# Patient Record
Sex: Female | Born: 1980 | Race: Black or African American | Hispanic: No | Marital: Married | Smoking: Never smoker
Health system: Southern US, Community
[De-identification: ages and names within clinical notes are randomized; demographics above are authoritative.]

## PROBLEM LIST (undated history)

## (undated) DIAGNOSIS — R51 Headache: Secondary | ICD-10-CM

## (undated) DIAGNOSIS — R011 Cardiac murmur, unspecified: Secondary | ICD-10-CM

## (undated) DIAGNOSIS — F419 Anxiety disorder, unspecified: Secondary | ICD-10-CM

## (undated) HISTORY — DX: Cardiac murmur, unspecified: R01.1

---

## 1999-10-03 ENCOUNTER — Emergency Department (HOSPITAL_COMMUNITY): Admission: EM | Admit: 1999-10-03 | Discharge: 1999-10-03 | Payer: Self-pay | Admitting: Emergency Medicine

## 2000-01-22 ENCOUNTER — Emergency Department (HOSPITAL_COMMUNITY): Admission: EM | Admit: 2000-01-22 | Discharge: 2000-01-22 | Payer: Self-pay | Admitting: Emergency Medicine

## 2000-01-25 ENCOUNTER — Emergency Department (HOSPITAL_COMMUNITY): Admission: EM | Admit: 2000-01-25 | Discharge: 2000-01-25 | Payer: Self-pay | Admitting: Emergency Medicine

## 2000-06-07 ENCOUNTER — Inpatient Hospital Stay (HOSPITAL_COMMUNITY): Admission: AD | Admit: 2000-06-07 | Discharge: 2000-06-07 | Payer: Self-pay | Admitting: Obstetrics & Gynecology

## 2000-06-07 ENCOUNTER — Encounter: Payer: Self-pay | Admitting: Obstetrics & Gynecology

## 2000-07-03 ENCOUNTER — Ambulatory Visit (HOSPITAL_COMMUNITY): Admission: AD | Admit: 2000-07-03 | Discharge: 2000-07-03 | Payer: Self-pay | Admitting: Obstetrics and Gynecology

## 2000-07-03 ENCOUNTER — Encounter (INDEPENDENT_AMBULATORY_CARE_PROVIDER_SITE_OTHER): Payer: Self-pay

## 2001-01-26 ENCOUNTER — Inpatient Hospital Stay (HOSPITAL_COMMUNITY): Admission: AD | Admit: 2001-01-26 | Discharge: 2001-01-26 | Payer: Self-pay | Admitting: Obstetrics

## 2001-03-03 ENCOUNTER — Encounter: Payer: Self-pay | Admitting: Obstetrics

## 2001-03-03 ENCOUNTER — Inpatient Hospital Stay (HOSPITAL_COMMUNITY): Admission: AD | Admit: 2001-03-03 | Discharge: 2001-03-03 | Payer: Self-pay | Admitting: Obstetrics

## 2001-05-07 HISTORY — PX: CHOLECYSTECTOMY: SHX55

## 2002-04-13 ENCOUNTER — Emergency Department (HOSPITAL_COMMUNITY): Admission: EM | Admit: 2002-04-13 | Discharge: 2002-04-13 | Payer: Self-pay | Admitting: Emergency Medicine

## 2003-03-18 ENCOUNTER — Emergency Department (HOSPITAL_COMMUNITY): Admission: EM | Admit: 2003-03-18 | Discharge: 2003-03-18 | Payer: Self-pay | Admitting: Emergency Medicine

## 2003-05-15 ENCOUNTER — Emergency Department (HOSPITAL_COMMUNITY): Admission: EM | Admit: 2003-05-15 | Discharge: 2003-05-15 | Payer: Self-pay | Admitting: Emergency Medicine

## 2003-07-09 ENCOUNTER — Emergency Department (HOSPITAL_COMMUNITY): Admission: EM | Admit: 2003-07-09 | Discharge: 2003-07-09 | Payer: Self-pay | Admitting: *Deleted

## 2003-08-06 ENCOUNTER — Emergency Department (HOSPITAL_COMMUNITY): Admission: EM | Admit: 2003-08-06 | Discharge: 2003-08-06 | Payer: Self-pay | Admitting: Emergency Medicine

## 2003-08-07 ENCOUNTER — Emergency Department (HOSPITAL_COMMUNITY): Admission: EM | Admit: 2003-08-07 | Discharge: 2003-08-07 | Payer: Self-pay | Admitting: Emergency Medicine

## 2003-11-17 ENCOUNTER — Emergency Department (HOSPITAL_COMMUNITY): Admission: EM | Admit: 2003-11-17 | Discharge: 2003-11-18 | Payer: Self-pay | Admitting: Emergency Medicine

## 2004-01-02 ENCOUNTER — Emergency Department (HOSPITAL_COMMUNITY): Admission: EM | Admit: 2004-01-02 | Discharge: 2004-01-02 | Payer: Self-pay | Admitting: Emergency Medicine

## 2004-01-15 ENCOUNTER — Emergency Department (HOSPITAL_COMMUNITY): Admission: EM | Admit: 2004-01-15 | Discharge: 2004-01-15 | Payer: Self-pay | Admitting: Emergency Medicine

## 2004-04-08 ENCOUNTER — Emergency Department (HOSPITAL_COMMUNITY): Admission: EM | Admit: 2004-04-08 | Discharge: 2004-04-08 | Payer: Self-pay | Admitting: Emergency Medicine

## 2004-05-07 ENCOUNTER — Emergency Department (HOSPITAL_COMMUNITY): Admission: EM | Admit: 2004-05-07 | Discharge: 2004-05-07 | Payer: Self-pay | Admitting: Emergency Medicine

## 2004-06-09 ENCOUNTER — Emergency Department (HOSPITAL_COMMUNITY): Admission: EM | Admit: 2004-06-09 | Discharge: 2004-06-09 | Payer: Self-pay | Admitting: Emergency Medicine

## 2004-08-20 ENCOUNTER — Emergency Department (HOSPITAL_COMMUNITY): Admission: EM | Admit: 2004-08-20 | Discharge: 2004-08-21 | Payer: Self-pay | Admitting: Emergency Medicine

## 2004-10-18 ENCOUNTER — Emergency Department (HOSPITAL_COMMUNITY): Admission: EM | Admit: 2004-10-18 | Discharge: 2004-10-18 | Payer: Self-pay | Admitting: Family Medicine

## 2004-10-19 ENCOUNTER — Emergency Department (HOSPITAL_COMMUNITY): Admission: EM | Admit: 2004-10-19 | Discharge: 2004-10-19 | Payer: Self-pay | Admitting: Emergency Medicine

## 2004-12-05 ENCOUNTER — Ambulatory Visit: Payer: Self-pay | Admitting: Internal Medicine

## 2005-04-16 ENCOUNTER — Emergency Department (HOSPITAL_COMMUNITY): Admission: EM | Admit: 2005-04-16 | Discharge: 2005-04-16 | Payer: Self-pay | Admitting: Emergency Medicine

## 2005-05-22 ENCOUNTER — Emergency Department (HOSPITAL_COMMUNITY): Admission: EM | Admit: 2005-05-22 | Discharge: 2005-05-22 | Payer: Self-pay | Admitting: Emergency Medicine

## 2005-05-25 ENCOUNTER — Emergency Department (HOSPITAL_COMMUNITY): Admission: EM | Admit: 2005-05-25 | Discharge: 2005-05-25 | Payer: Self-pay | Admitting: Emergency Medicine

## 2005-06-03 ENCOUNTER — Emergency Department (HOSPITAL_COMMUNITY): Admission: EM | Admit: 2005-06-03 | Discharge: 2005-06-03 | Payer: Self-pay | Admitting: Family Medicine

## 2005-07-07 ENCOUNTER — Emergency Department (HOSPITAL_COMMUNITY): Admission: EM | Admit: 2005-07-07 | Discharge: 2005-07-08 | Payer: Self-pay | Admitting: Emergency Medicine

## 2005-10-31 ENCOUNTER — Emergency Department (HOSPITAL_COMMUNITY): Admission: EM | Admit: 2005-10-31 | Discharge: 2005-10-31 | Payer: Self-pay | Admitting: Emergency Medicine

## 2006-03-31 ENCOUNTER — Emergency Department (HOSPITAL_COMMUNITY): Admission: EM | Admit: 2006-03-31 | Discharge: 2006-03-31 | Payer: Self-pay | Admitting: Family Medicine

## 2008-05-07 HISTORY — PX: OTHER SURGICAL HISTORY: SHX169

## 2008-06-29 ENCOUNTER — Ambulatory Visit (HOSPITAL_COMMUNITY): Admission: RE | Admit: 2008-06-29 | Discharge: 2008-06-29 | Payer: Self-pay | Admitting: Obstetrics

## 2008-07-23 ENCOUNTER — Ambulatory Visit (HOSPITAL_COMMUNITY): Admission: RE | Admit: 2008-07-23 | Discharge: 2008-07-23 | Payer: Self-pay | Admitting: Obstetrics

## 2010-08-17 LAB — CBC
HCT: 37.7 % (ref 36.0–46.0)
Hemoglobin: 12.5 g/dL (ref 12.0–15.0)
MCHC: 33.2 g/dL (ref 30.0–36.0)
MCV: 92.3 fL (ref 78.0–100.0)
Platelets: 271 10*3/uL (ref 150–400)
RBC: 4.08 MIL/uL (ref 3.87–5.11)
RDW: 13.4 % (ref 11.5–15.5)
WBC: 2.9 10*3/uL — ABNORMAL LOW (ref 4.0–10.5)

## 2010-08-17 LAB — PREGNANCY, URINE: Preg Test, Ur: NEGATIVE

## 2010-09-19 NOTE — Op Note (Signed)
April Newton, April Newton                  ACCOUNT NO.:  [id number]   MEDICAL RECORD NO.:  [id number]          PATIENT TYPE:  AMB   LOCATION:  SDC                           FACILITY:  [facility]   PHYSICIAN:  Charles A. Clearance Coots, M.D.DATE OF BIRTH:  [birth year]   DATE OF PROCEDURE:  07/23/2008  DATE OF DISCHARGE:                               OPERATIVE REPORT   PREOPERATIVE DIAGNOSIS:  Menorrhagia.   POSTOPERATIVE DIAGNOSIS:  Menorrhagia.   PROCEDURES:  Hysteroscopy, NovaSure bipolar endometrial ablation, and  insertion of Mirena intrauterine device.   SURGEON:  Charles A. Clearance Coots, MD   ANESTHESIA:  General.   ESTIMATED BLOOD LOSS:  Minimal.   COMPLICATIONS:  None.   SPECIMEN:  None.   FINDINGS:  Normal endometrial cavity.   OPERATION:  The patient was brought to the operating room and after  satisfactory general endotracheal anesthesia, the vagina was prepped and  draped in the usual sterile fashion.  The urinary bladder was emptied of  approximately 100 mL of clear urine.  Bimanual examination revealed the  uterus to be mid position.  A sterile Graves speculum was inserted in  the vaginal vault and the cervix was isolated.  The anterior lip of the  cervix was grasped with a single-tooth tenaculum.  Paracervical block of  approximately 20 mL of 1% Xylocaine was injected in the each lateral  fornix with total of 10 mL in each lateral fornix.  The cervical canal  was then measured 4 cm from the external os to the internal os with a  Hegar dilator.  The uterus was then sounded to 11 cm equaling a total  cavity length of 6.5 cm, cavity width was 3.5 cm.  The 5-mm hysteroscope  was then passed into the uterine cavity and a survey of the endometrial  cavity was performed.  There were no polyps or submucous fibroids  observed.  The hysteroscope was then removed.  The NovaSure bipolar  ablation instrument was then passed into the uterine cavity, and the  bipolar endometrial ablation was then  performed in routine fashion at a  cavity width of 3.5 cm at a power of 125 watts for 1 minute and 23  seconds.  All instruments were then retired.  The Mirena IUD was then  inserted into  the uterine cavity in routine fashion without complications, and the  string was cut approximately 1 to 1-1/2 inches.  All instruments were  then again retired, and there was no active bleeding at the conclusion  of the procedure.  The patient tolerated the procedure well and was  transported to the recovery room in satisfactory condition.       Charles A. Clearance Coots, M.D.  Electronically Signed     CAH/MEDQ  D:  07/23/2008  T:  07/24/2008  Job:  [id number]

## 2010-09-22 NOTE — Op Note (Signed)
[facility]  Patient:    April Newton, April Newton                          MRN: [id number] Proc. Date: 07/03/00 Adm. Date:  [id number] Attending:  Leonard Schwartz                           Operative Report  PREOPERATIVE DIAGNOSIS:         First trimester missed abortion.  POSTOPERATIVE DIAGNOSIS:        First trimester missed abortion.  OPERATION:                      Suction dilatation and evacuation.  SURGEON:                        Janine Limbo, M.D.  ANESTHESIA:                     IV sedation and paracervical block.  INDICATIONS:                    The patient is a 30 year old who presents with a nine week gestation.  Ultrasound confirms that there is no fetal heart motion present any longer and that the gestation is nonviable.  The patient presents today with bleeding and cramping.  She understands the indications for her surgical procedure and she accepts the risks of, but not limited to, anesthetic complications, bleeding, infections and possible damage to the surrounding organs.  FINDINGS:                       The patients blood type is A positive.  The uterus was approximately 10 weeks size.  No adnexal masses were appreciated. A moderate to large amount of products of conception were removed from within the uterine cavity.  DESCRIPTION OF PROCEDURE:       The patient was taken to the operating room where she was given medications through her IV line.  The patients perineum and vagina were prepped with multiple layers of Betadine.  The bladder was drained of urine.  The patient was sterilely draped.  Examination under anesthesia was performed.  A paracervical block was placed using 10 cc of 0.5% Marcaine.  The cervix sounded to 10 cm.  The cervix was already slightly dilated.  It was dilated even further.  The uterine cavity was then evacuated using a #8 suction curet and a medium sharp curet.  The cavity was felt to be clean at the  end of the procedure.  Hemostasis was noted to be adequate.  The patient was awakened from her anesthetic and taken to the recovery room in stable condition.  The uterus was noted to be firm at the end of our procedure.  Sponge, needle and instrument counts were correct.  The estimated blood loss was 20 cc.  FOLLOW-UP INSTRUCTIONS:  The patient will return to the office in two to three weeks for follow-up examination.  She was given a copy of the postoperative instruction sheet as prepared by the [facility] for patients who have undergone a D&C.  She was given a prescription for Darvocet-N 100 mg (one q.6h. p.r.n. pain). DD:  07/03/00 TD:  07/04/00 Job: [id number]

## 2011-04-20 ENCOUNTER — Other Ambulatory Visit: Payer: Self-pay | Admitting: Obstetrics

## 2011-05-08 HISTORY — PX: BREAST EXCISIONAL BIOPSY: SUR124

## 2011-07-09 ENCOUNTER — Other Ambulatory Visit: Payer: Self-pay | Admitting: Obstetrics

## 2011-09-21 ENCOUNTER — Other Ambulatory Visit: Payer: Self-pay | Admitting: Obstetrics

## 2011-09-21 DIAGNOSIS — N6452 Nipple discharge: Secondary | ICD-10-CM

## 2011-09-27 ENCOUNTER — Ambulatory Visit
Admission: RE | Admit: 2011-09-27 | Discharge: 2011-09-27 | Disposition: A | Discharge status: Home / Self Care | Payer: Managed Care, Other (non HMO) | Source: Ambulatory Visit | Attending: Obstetrics | Admitting: Obstetrics

## 2011-09-27 ENCOUNTER — Other Ambulatory Visit: Payer: Self-pay | Admitting: Obstetrics

## 2011-09-27 DIAGNOSIS — N6452 Nipple discharge: Secondary | ICD-10-CM

## 2011-10-03 ENCOUNTER — Ambulatory Visit (INDEPENDENT_AMBULATORY_CARE_PROVIDER_SITE_OTHER): Payer: Managed Care, Other (non HMO) | Admitting: Surgery

## 2011-10-03 ENCOUNTER — Encounter (INDEPENDENT_AMBULATORY_CARE_PROVIDER_SITE_OTHER): Payer: Self-pay | Admitting: Surgery

## 2011-10-03 VITALS — BP 134/86 | HR 94 | Temp 98.1°F | Ht 66.0 in | Wt 148.4 lb

## 2011-10-03 DIAGNOSIS — N6459 Other signs and symptoms in breast: Secondary | ICD-10-CM

## 2011-10-03 DIAGNOSIS — N6452 Nipple discharge: Secondary | ICD-10-CM | POA: Insufficient documentation

## 2011-10-03 NOTE — Patient Instructions (Signed)
We will arrange surgery to remove some ductal tissue from the right breast

## 2011-10-03 NOTE — Progress Notes (Signed)
NAME: April Newton DOB: 07/24/1980 MRN: [id number]                                                                                      DATE: 10/03/2011  PCP: No primary provider on file. Referring Provider: No ref. 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 Breast Discharge    eval Rt nipple d/c    HPI:  April Newton is a 31 y.o.  female who presents for evaluation of nipple discharge, right, of two or three weeks duration with no other symptoms. Mother's cousin is only FH of breast cancer. She had mammogram and ductogram and a dilated duct with filling defect was noted  PMH:  has a past medical history of Heart murmur.  PSH:   has past surgical history that includes Cesarean section (06/15/2001); Cholecystectomy (2003); and uterine ablasion (2010).  ALLERGIES:  No Known Allergies  MEDICATIONS: Current outpatient prescriptions:Butalbital-APAP-Caffeine (FIORICET PO), Take 40 mg by mouth as needed., Disp: , Rfl: ;  Butalbital-Aspirin-Caffeine (FARBITAL PO), Take by mouth., Disp: , Rfl: ;  ibuprofen (ADVIL,MOTRIN) 800 MG tablet, Take 800 mg by mouth as needed., Disp: , Rfl: ;  topiramate (TOPAMAX) 50 MG tablet, Take 50 mg by mouth daily., Disp: , Rfl:   ROS: Negative  EXAM:   GENERAL:  The patient is alert, oriented, and generally healthy-appearing, NAD. Mood and affect are normal.  HEENT:  The head is normocephalic, the eyes nonicteric, the pupils were round regular and equal. EOMs are normal. Pharynx normal. Dentition good.  NECK:  The neck is supple and there are no masses or thyromegaly.  LUNGS: Normal respirations and clear to auscultation.  HEART: Regular rhythm, with no murmurs rubs or gallops. Pulses are intact carotid dorsalis pedis and posterior tibial. No significant  varicosities are noted.  BREASTS:  Right breast has an easily expressible discharge which appear tinged with blood. No mass or other abnormality and the left breast is normal  LYMPHATICS: No axillary or supraclavicular adenopathy noted  ABDOMEN: Soft, flat, and nontender. No masses or organomegaly is noted. No hernias are noted. Bowel sounds are normal.  EXTREMITIES:  Good range of motion, no edema.   DATA REVIEWED:  Mammogram and ductogram reports    Johnthan Axtman J 10/03/2011         

## 2011-10-12 ENCOUNTER — Encounter (HOSPITAL_COMMUNITY): Payer: Self-pay | Admitting: Pharmacy Technician

## 2011-10-15 NOTE — Progress Notes (Addendum)
Call to pt. On home phone, voicemail is full. Call all three phone nos., left voicemail x2 lines.

## 2011-10-16 ENCOUNTER — Inpatient Hospital Stay (HOSPITAL_COMMUNITY)
Admission: RE | Admit: 2011-10-16 | Discharge: 2011-10-16 | Payer: Managed Care, Other (non HMO) | Source: Ambulatory Visit

## 2011-10-16 ENCOUNTER — Encounter (HOSPITAL_COMMUNITY): Payer: Self-pay

## 2011-10-16 HISTORY — DX: Headache: R51

## 2011-10-16 HISTORY — DX: Anxiety disorder, unspecified: F41.9

## 2011-10-16 NOTE — Pre-Procedure Instructions (Signed)
[name]  10/16/2011   Your procedure is scheduled on  10-23-2011  Report to [facility] Short Stay [facility] at 5:30 AM.  Call this number if you have problems the morning of surgery: [phone]   Remember:   Do not eat food:After Midnight.  May have clear liquids: up to 4 Hours before arrival.  Clear liquids include soda, tea, black coffee, apple or grape juice, broth.until 1:30 AM  Take these medicines the morning of surgery with A SIP OF WATER  Fioricet if needed for headache   Do not wear jewelry, make-up or nail polish.  Do not wear lotions, powders, or perfumes. You may wear deodorant.  Do not shave 48 hours prior to surgery. Men may shave face and neck.  Do not bring valuables to the hospital.  Contacts, dentures or bridgework may not be worn into surgery.  Leave suitcase in the car. After surgery it may be brought to your room.  For patients admitted to the hospital, checkout time is 11:00 AM the day of discharge.   Patients discharged the day of surgery will not be allowed to drive home.  Name and phone number of your driver  Special Instructions: CHG Shower Use Special Wash: 1/2 bottle night before surgery and 1/2 bottle morning of surgery.   Please read over the following fact sheets that you were given: Pain Booklet, Coughing and Deep Breathing, MRSA Information and Surgical Site Infection Prevention

## 2011-10-17 ENCOUNTER — Ambulatory Visit (HOSPITAL_BASED_OUTPATIENT_CLINIC_OR_DEPARTMENT_OTHER): Admit: 2011-10-17 | Payer: Self-pay | Admitting: Surgery

## 2011-10-17 ENCOUNTER — Encounter (HOSPITAL_BASED_OUTPATIENT_CLINIC_OR_DEPARTMENT_OTHER): Payer: Self-pay

## 2011-10-17 SURGERY — EXCISION DUCTAL SYSTEM BREAST
Anesthesia: General | Laterality: Right

## 2011-10-17 NOTE — Pre-Procedure Instructions (Signed)
[name]  10/17/2011   Your procedure is scheduled on:  Tuesday, June 18th.  Report to [facility] at 5:30AM.  Call this number if you have problems the morning of surgery: [phone]   Remember:   Do not eat food or dirnk any liquid and midnight on Monday, June 17th.   Take these medicines the morning of surgery with A SIP OF WATER: Topamax.  Stop Ibuprofen (Advil, Motrin) and NSAIDS, Aspirin or herbal medications.   Do not wear jewelry, make-up or nail polish.  Do not wear lotions, powders, or perfumes. You may wear deodorant.  Do not shave 48 hours prior to surgery. Men may shave face and neck.  Do not bring valuables to the hospital.  Contacts, dentures or bridgework may not be worn into surgery.  Leave suitcase in the car. After surgery it may be brought to your room.  For patients admitted to the hospital, checkout time is 11:00 AM the day of discharge.   Patients discharged the day of surgery will not be allowed to drive home.  Name and phone number of your driver: ___  Special Instructions: CHG Shower Use Special Wash: 1/2 bottle night before surgery and 1/2 bottle morning of surgery.   Please read over the following fact sheets that you were given: Pain Booklet, Coughing and Deep Breathing, MRSA Information and Surgical Site Infection Prevention

## 2011-10-18 ENCOUNTER — Encounter (HOSPITAL_COMMUNITY)
Admission: RE | Admit: 2011-10-18 | Discharge: 2011-10-18 | Disposition: A | Discharge status: Home / Self Care | Payer: Managed Care, Other (non HMO) | Source: Ambulatory Visit | Attending: Surgery | Admitting: Surgery

## 2011-10-18 LAB — CBC
HCT: 39.1 % (ref 36.0–46.0)
Hemoglobin: 13.2 g/dL (ref 12.0–15.0)
MCH: 30.5 pg (ref 26.0–34.0)
MCHC: 33.8 g/dL (ref 30.0–36.0)
MCV: 90.3 fL (ref 78.0–100.0)
Platelets: 248 10*3/uL (ref 150–400)
RBC: 4.33 MIL/uL (ref 3.87–5.11)
RDW: 12.8 % (ref 11.5–15.5)
WBC: 3 10*3/uL — ABNORMAL LOW (ref 4.0–10.5)

## 2011-10-18 LAB — HCG, SERUM, QUALITATIVE: Preg, Serum: NEGATIVE

## 2011-10-18 LAB — SURGICAL PCR SCREEN
MRSA, PCR: NEGATIVE
Staphylococcus aureus: POSITIVE — AB

## 2011-10-22 MED ORDER — CEFAZOLIN SODIUM 1-5 GM-% IV SOLN
1.0000 g | INTRAVENOUS | Status: AC
  Administered 2011-10-23: 1 g via INTRAVENOUS
  Filled 2011-10-22: qty 50

## 2011-10-23 ENCOUNTER — Encounter (HOSPITAL_COMMUNITY): Payer: Self-pay | Admitting: Anesthesiology

## 2011-10-23 ENCOUNTER — Ambulatory Visit (HOSPITAL_COMMUNITY): Payer: Managed Care, Other (non HMO) | Admitting: Anesthesiology

## 2011-10-23 ENCOUNTER — Ambulatory Visit (HOSPITAL_COMMUNITY)
Admission: RE | Admit: 2011-10-23 | Discharge: 2011-10-23 | Disposition: A | Discharge status: Home / Self Care | Payer: Managed Care, Other (non HMO) | Source: Ambulatory Visit | Attending: Surgery | Admitting: Surgery

## 2011-10-23 ENCOUNTER — Encounter (HOSPITAL_COMMUNITY)
Admission: RE | Disposition: A | Discharge status: Home / Self Care | Payer: Self-pay | Source: Ambulatory Visit | Attending: Surgery

## 2011-10-23 ENCOUNTER — Encounter (HOSPITAL_COMMUNITY): Payer: Self-pay | Admitting: *Deleted

## 2011-10-23 DIAGNOSIS — D249 Benign neoplasm of unspecified breast: Secondary | ICD-10-CM | POA: Insufficient documentation

## 2011-10-23 DIAGNOSIS — N6019 Diffuse cystic mastopathy of unspecified breast: Secondary | ICD-10-CM

## 2011-10-23 DIAGNOSIS — N6452 Nipple discharge: Secondary | ICD-10-CM

## 2011-10-23 DIAGNOSIS — R51 Headache: Secondary | ICD-10-CM | POA: Insufficient documentation

## 2011-10-23 DIAGNOSIS — Z01812 Encounter for preprocedural laboratory examination: Secondary | ICD-10-CM | POA: Insufficient documentation

## 2011-10-23 HISTORY — PX: BREAST DUCTAL SYSTEM EXCISION: SHX5242

## 2011-10-23 SURGERY — EXCISION DUCTAL SYSTEM BREAST
Anesthesia: General | Site: Breast | Laterality: Right | Wound class: Clean

## 2011-10-23 MED ORDER — HYDROMORPHONE HCL PF 1 MG/ML IJ SOLN: 0.2500 mg | INTRAMUSCULAR | Status: DC | PRN

## 2011-10-23 MED ORDER — ONDANSETRON HCL 4 MG/2ML IJ SOLN: 4.0000 mg | Freq: Once | INTRAMUSCULAR | Status: DC | PRN

## 2011-10-23 MED ORDER — CHLORHEXIDINE GLUCONATE 4 % EX LIQD: 1.0000 "application " | Freq: Once | CUTANEOUS | Status: DC

## 2011-10-23 MED ORDER — BUPIVACAINE HCL (PF) 0.25 % IJ SOLN
INTRAMUSCULAR | Status: DC | PRN
  Administered 2011-10-23: 30 mL

## 2011-10-23 MED ORDER — 0.9 % SODIUM CHLORIDE (POUR BTL) OPTIME
TOPICAL | Status: DC | PRN
  Administered 2011-10-23: 1000 mL

## 2011-10-23 MED ORDER — PROPOFOL 10 MG/ML IV BOLUS
INTRAVENOUS | Status: DC | PRN
  Administered 2011-10-23: 200 mg via INTRAVENOUS

## 2011-10-23 MED ORDER — MIDAZOLAM HCL 5 MG/5ML IJ SOLN
INTRAMUSCULAR | Status: DC | PRN
  Administered 2011-10-23: 2 mg via INTRAVENOUS

## 2011-10-23 MED ORDER — METHYLENE BLUE 1 % INJ SOLN
INTRAMUSCULAR | Status: AC
  Filled 2011-10-23: qty 10

## 2011-10-23 MED ORDER — FENTANYL CITRATE 0.05 MG/ML IJ SOLN
INTRAMUSCULAR | Status: DC | PRN
  Administered 2011-10-23: 150 ug via INTRAVENOUS

## 2011-10-23 MED ORDER — ONDANSETRON HCL 4 MG/2ML IJ SOLN
INTRAMUSCULAR | Status: DC | PRN
  Administered 2011-10-23: 4 mg via INTRAVENOUS

## 2011-10-23 MED ORDER — HYDROCODONE-ACETAMINOPHEN 5-325 MG PO TABS: 1.0000 | ORAL_TABLET | ORAL | Status: DC | PRN

## 2011-10-23 MED ORDER — BUPIVACAINE HCL (PF) 0.25 % IJ SOLN
INTRAMUSCULAR | Status: AC
  Filled 2011-10-23: qty 30

## 2011-10-23 MED ORDER — SODIUM CHLORIDE 0.9 % IJ SOLN
INTRAMUSCULAR | Status: DC | PRN
  Administered 2011-10-23: 08:00:00

## 2011-10-23 MED ORDER — LACTATED RINGERS IV SOLN
INTRAVENOUS | Status: DC | PRN
  Administered 2011-10-23: 07:00:00 via INTRAVENOUS

## 2011-10-23 MED ORDER — LIDOCAINE HCL (CARDIAC) 20 MG/ML IV SOLN
INTRAVENOUS | Status: DC | PRN
  Administered 2011-10-23: 70 mg via INTRAVENOUS

## 2011-10-23 SURGICAL SUPPLY — 53 items
ADH SKN CLS LQ APL DERMABOND (GAUZE/BANDAGES/DRESSINGS) ×1
APL SKNCLS STERI-STRIP NONHPOA (GAUZE/BANDAGES/DRESSINGS) ×1
BANDAGE ELASTIC 6 VELCRO ST LF (GAUZE/BANDAGES/DRESSINGS) ×2 IMPLANT
BENZOIN TINCTURE PRP APPL 2/3 (GAUZE/BANDAGES/DRESSINGS) ×2 IMPLANT
BINDER BREAST LRG (GAUZE/BANDAGES/DRESSINGS) ×1 IMPLANT
BLADE SURG 15 STRL LF DISP TIS (BLADE) ×1 IMPLANT
BLADE SURG 15 STRL SS (BLADE) ×2
CANISTER SUCTION 2500CC (MISCELLANEOUS) ×2 IMPLANT
CHLORAPREP W/TINT 26ML (MISCELLANEOUS) ×2 IMPLANT
CLIP TI WIDE RED SMALL 6 (CLIP) ×1 IMPLANT
CLOTH BEACON ORANGE TIMEOUT ST (SAFETY) ×2 IMPLANT
CONT SPEC 4OZ CLIKSEAL STRL BL (MISCELLANEOUS) ×2 IMPLANT
COVER SURGICAL LIGHT HANDLE (MISCELLANEOUS) ×2 IMPLANT
DECANTER SPIKE VIAL GLASS SM (MISCELLANEOUS) IMPLANT
DERMABOND ADHESIVE PROPEN (GAUZE/BANDAGES/DRESSINGS) ×1
DERMABOND ADVANCED .7 DNX6 (GAUZE/BANDAGES/DRESSINGS) IMPLANT
DRAPE LAPAROTOMY TRNSV 102X78 (DRAPE) ×2 IMPLANT
DRAPE UTILITY 15X26 W/TAPE STR (DRAPE) ×4 IMPLANT
ELECT CAUTERY BLADE 6.4 (BLADE) ×2 IMPLANT
ELECT REM PT RETURN 9FT ADLT (ELECTROSURGICAL) ×2
ELECTRODE REM PT RTRN 9FT ADLT (ELECTROSURGICAL) ×1 IMPLANT
GAUZE SPONGE 4X4 16PLY XRAY LF (GAUZE/BANDAGES/DRESSINGS) ×2 IMPLANT
GLOVE BIOGEL PI IND STRL 7.5 (GLOVE) IMPLANT
GLOVE BIOGEL PI INDICATOR 7.5 (GLOVE) ×1
GLOVE EUDERMIC 7 POWDERFREE (GLOVE) ×2 IMPLANT
GLOVE SURG SS PI 7.0 STRL IVOR (GLOVE) ×1 IMPLANT
GOWN PREVENTION PLUS XLARGE (GOWN DISPOSABLE) ×2 IMPLANT
GOWN STRL NON-REIN LRG LVL3 (GOWN DISPOSABLE) ×2 IMPLANT
KIT BASIN OR (CUSTOM PROCEDURE TRAY) ×2 IMPLANT
KIT ROOM TURNOVER OR (KITS) ×2 IMPLANT
MARGIN MAP 10MM (MISCELLANEOUS) IMPLANT
NDL 18GX1X1/2 (RX/OR ONLY) (NEEDLE) IMPLANT
NDL HYPO 25GX1X1/2 BEV (NEEDLE) IMPLANT
NEEDLE 18GX1X1/2 (RX/OR ONLY) (NEEDLE) ×2 IMPLANT
NEEDLE HYPO 25GX1X1/2 BEV (NEEDLE) ×4 IMPLANT
NS IRRIG 1000ML POUR BTL (IV SOLUTION) ×2 IMPLANT
PACK SURGICAL SETUP 50X90 (CUSTOM PROCEDURE TRAY) ×2 IMPLANT
PAD ARMBOARD 7.5X6 YLW CONV (MISCELLANEOUS) ×4 IMPLANT
PENCIL BUTTON HOLSTER BLD 10FT (ELECTRODE) ×2 IMPLANT
SPONGE GAUZE 4X4 12PLY (GAUZE/BANDAGES/DRESSINGS) ×2 IMPLANT
SPONGE LAP 4X18 X RAY DECT (DISPOSABLE) ×1 IMPLANT
STRIP CLOSURE SKIN 1/4X4 (GAUZE/BANDAGES/DRESSINGS) ×2 IMPLANT
SUT MON AB 4-0 PC3 18 (SUTURE) ×2 IMPLANT
SUT VIC AB 3-0 CT1 27 (SUTURE) ×2
SUT VIC AB 3-0 CT1 TAPERPNT 27 (SUTURE) ×1 IMPLANT
SUT VIC AB 3-0 SH 18 (SUTURE) ×2 IMPLANT
SYR BULB 3OZ (MISCELLANEOUS) ×2 IMPLANT
SYR CONTROL 10ML LL (SYRINGE) ×2 IMPLANT
TOWEL OR 17X24 6PK STRL BLUE (TOWEL DISPOSABLE) ×1 IMPLANT
TOWEL OR 17X26 10 PK STRL BLUE (TOWEL DISPOSABLE) ×2 IMPLANT
TUBE CONNECTING 12X1/4 (SUCTIONS) ×2 IMPLANT
WATER STERILE IRR 1000ML POUR (IV SOLUTION) ×1 IMPLANT
YANKAUER SUCT BULB TIP NO VENT (SUCTIONS) ×2 IMPLANT

## 2011-10-23 NOTE — Anesthesia Postprocedure Evaluation (Signed)
  Anesthesia Post-op Note  Patient: April Newton  Procedure(s) Performed: Procedure(s) (LRB): EXCISION DUCTAL SYSTEM BREAST (Right)  Patient Location: pacu  Anesthesia Type: General  Level of Consciousness: awake, oriented and patient cooperative  Airway and Oxygen Therapy: Patient Spontanous Breathing and Patient connected to nasal cannula oxygen  Post-op Pain: mild  Post-op Assessment: Post-op Vital signs reviewed, Patient's Cardiovascular Status Stable, Respiratory Function Stable, Patent Airway, No signs of Nausea or vomiting and Pain level controlled  Post-op Vital Signs: stable  Complications: No apparent anesthesia complications

## 2011-10-23 NOTE — Transfer of Care (Signed)
Immediate Anesthesia Transfer of Care Note  Patient: April Newton  Procedure(s) Performed: Procedure(s) (LRB): EXCISION DUCTAL SYSTEM BREAST (Right)  Patient Location: PACU  Anesthesia Type: General  Level of Consciousness: awake, alert  and oriented  Airway & Oxygen Therapy: Patient Spontanous Breathing and Patient connected to nasal cannula oxygen  Post-op Assessment: Report given to PACU RN, Post -op Vital signs reviewed and stable and Patient moving all extremities X 4  Post vital signs: Reviewed and stable  Complications: No apparent anesthesia complications

## 2011-10-23 NOTE — Anesthesia Procedure Notes (Signed)
Procedure Name: LMA Insertion Date/Time: 10/23/2011 7:31 AM Performed by: Sharlene Dory E Pre-anesthesia Checklist: Patient identified, Emergency Drugs available, Suction available, Patient being monitored and Timeout performed Patient Re-evaluated:Patient Re-evaluated prior to inductionOxygen Delivery Method: Circle system utilized Preoxygenation: Pre-oxygenation with 100% oxygen Intubation Type: IV induction LMA: LMA inserted LMA Size: 4.0 Number of attempts: 1 Tube secured with: Tape Dental Injury: Teeth and Oropharynx as per pre-operative assessment

## 2011-10-23 NOTE — Interval H&P Note (Signed)
History and Physical Interval Note:  10/23/2011 7:19 AM  April Newton  has presented today for surgery, with the diagnosis of nipple discharge Right breast  The various methods of treatment have been discussed with the patient and family. After consideration of risks, benefits and other options for treatment, the patient has consented to  Procedure(s) (LRB): EXCISION DUCTAL SYSTEM BREAST (Right) as a surgical intervention .  The patient's history has been reviewed, patient examined, no change in status, stable for surgery.  I have reviewed the patients' chart and labs.  Questions were answered to the patient's satisfaction.    The right breast is marked as the operative side.Tekela Garguilo J  10/23/2011 7:20 AM

## 2011-10-23 NOTE — H&P (View-Only) (Signed)
NAME: April Newton DOB: January 03, 1981 MRN: [id number]                                                                                      DATE: 10/03/2011  PCP: No primary provider on file. Referring Provider: No ref. 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 Breast Discharge    eval Rt nipple d/c    HPI:  April Newton is a 31 y.o.  female who presents for evaluation of nipple discharge, right, of two or three weeks duration with no other symptoms. Mother's cousin is only FH of breast cancer. She had mammogram and ductogram and a dilated duct with filling defect was noted  PMH:  has a past medical history of Heart murmur.  PSH:   has past surgical history that includes Cesarean section (06/15/2001); Cholecystectomy (2003); and uterine ablasion (2010).  ALLERGIES:  No Known Allergies  MEDICATIONS: Current outpatient prescriptions:Butalbital-APAP-Caffeine (FIORICET PO), Take 40 mg by mouth as needed., Disp: , Rfl: ;  Butalbital-Aspirin-Caffeine (FARBITAL PO), Take by mouth., Disp: , Rfl: ;  ibuprofen (ADVIL,MOTRIN) 800 MG tablet, Take 800 mg by mouth as needed., Disp: , Rfl: ;  topiramate (TOPAMAX) 50 MG tablet, Take 50 mg by mouth daily., Disp: , Rfl:   ROS: Negative  EXAM:   GENERAL:  The patient is alert, oriented, and generally healthy-appearing, NAD. Mood and affect are normal.  HEENT:  The head is normocephalic, the eyes nonicteric, the pupils were round regular and equal. EOMs are normal. Pharynx normal. Dentition good.  NECK:  The neck is supple and there are no masses or thyromegaly.  LUNGS: Normal respirations and clear to auscultation.  HEART: Regular rhythm, with no murmurs rubs or gallops. Pulses are intact carotid dorsalis pedis and posterior tibial. No significant  varicosities are noted.  BREASTS:  Right breast has an easily expressible discharge which appear tinged with blood. No mass or other abnormality and the left breast is normal  LYMPHATICS: No axillary or supraclavicular adenopathy noted  ABDOMEN: Soft, flat, and nontender. No masses or organomegaly is noted. No hernias are noted. Bowel sounds are normal.  EXTREMITIES:  Good range of motion, no edema.   DATA REVIEWED:  Mammogram and ductogram reports    April Newton 10/03/2011

## 2011-10-23 NOTE — Anesthesia Preprocedure Evaluation (Addendum)
Anesthesia Evaluation  Patient identified by MRN, date of birth, ID band Patient awake    Reviewed: Allergy & Precautions, H&P , NPO status , Patient's Chart, lab work & pertinent test results  Airway Mallampati: I TM Distance: >3 FB Neck ROM: full    Dental  (+) Teeth Intact   Pulmonary  breath sounds clear to auscultation        Cardiovascular Rhythm:regular Rate:Normal     Neuro/Psych  Headaches,    GI/Hepatic   Endo/Other    Renal/GU      Musculoskeletal   Abdominal   Peds  Hematology   Anesthesia Other Findings   Reproductive/Obstetrics                          Anesthesia Physical Anesthesia Plan  ASA: I  Anesthesia Plan: General   Post-op Pain Management:    Induction: Intravenous  Airway Management Planned: LMA and Oral ETT  Additional Equipment:   Intra-op Plan:   Post-operative Plan: Extubation in OR  Informed Consent: I have reviewed the patients History and Physical, chart, labs and discussed the procedure including the risks, benefits and alternatives for the proposed anesthesia with the patient or authorized representative who has indicated his/her understanding and acceptance.     Plan Discussed with: CRNA, Anesthesiologist and Surgeon  Anesthesia Plan Comments:         Anesthesia Quick Evaluation

## 2011-10-23 NOTE — Preoperative (Signed)
Beta Blockers   Reason not to administer Beta Blockers:Not Applicable 

## 2011-10-23 NOTE — Op Note (Signed)
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 No masses palpable. Ductogram showed a filling defect consistent with a papilloma as well as more distal defects consistent with ductal ectasia. After discussion with the patient ductal excision was recommended the patient agreed.    Description of Procedure: I saw the patient preoperative area and we reviewed the plans for the procedure and all questions were answered. I marked the right breast the operative side.  The patient was taken to the operating room after satisfactory general anesthesia had been obtained the breast was prepped, draped, and a timeout performed. The right breast had a spontaneous and easily noticeable ductal discharge I attempted to cannulate the duct was with a 24-gauge Angiocath and some tear duct probes but was unsuccessful.I therefore made a curvilinear incision medially at the areolar margin. I elevated a very thin skin flap and disconnect the ducts from the nipple and saw what appeared to be a papillomatous-type material in one of the ducts as well as brownish fluid draining from any of the ducts. I took a  Cylinder of tissue around the duct going from the nipple medially and somewhat inferiorly. I thought I got all of the ductal tissue out although there was a small nodular density along the inferior margin that I noted after we had the main specimen out which I also resected. The specimen was marked for orientation.  As the soreness making sure everything was dry. I infiltrated 30 cc of 0.25% plain Marcaine. I then closed with several sutures of 3-0 Vicryl  followed by 4-0 Monocryl subcuticular and Dermabond.  The patient tolerated the procedure well. There were no complications. Counts are correct.  Currie Paris, MD, FACS 10/23/2011 8:21 AM

## 2011-10-23 NOTE — Discharge Instructions (Signed)
[facility] [phone]   BREAST BIOPSY/ PARTIAL MASTECTOMY: POST OP INSTRUCTIONS  Always review your discharge instruction sheet given to you by the facility where your surgery was performed.  IF YOU HAVE DISABILITY OR FAMILY LEAVE FORMS, YOU MUST BRING THEM TO THE OFFICE FOR PROCESSING.  DO NOT GIVE THEM TO YOUR DOCTOR.  1. A prescription for pain medication will be given to you upon discharge.  Take your pain medication as prescribed, as needed.  If narcotic pain medicine is not needed, then you may take ibuprofen (Advil) as needed. 2. Take your usually prescribed medications unless otherwise directed 3. If you need a refill on your pain medication, please contact your pharmacy.  They will contact our office to request authorization.  Prescriptions will not be filled after 5pm or on week-ends. 4. You should eat very light the first 24 hours after surgery, such as soup, crackers, pudding, etc.  Resume your normal diet the day after surgery. 5. Most patients will experience some swelling and bruising in the breast.  Ice packs and a good support bra will help.  Swelling and bruising can take several days to resolve.  6. It is common to experience some constipation if taking pain medication after surgery.  Increasing fluid intake and taking a stool softener will usually help or prevent this problem from occurring.  A mild laxative (Milk of Magnesia or Miralax) should be taken according to package directions if there are no bowel movements after 48 hours. 7. Unless discharge instructions indicate otherwise, you may remove your bandages 24 hours after surgery, and you may shower at that time.  If your surgeon used skin glue on the incision, you may shower in 24 hours.  The glue will flake off over the next 2-3 weeks. 8. DRAINS:  If you have drain, it is important to keep a list of the amount of drainage produced each day in your drains.  Before leaving the hospital, you should  be instructed on drain care.  Call our office if you have any questions about your drains. BE SURE TO BRING THE RECORD OF THE AMOUNT OF DRAINAGE TO YOUR OFFICE VISITS. We use this to determine when the drains can be removed. 9. ACTIVITIES:  You may resume regular daily activities (gradually increasing) beginning the next day.  Wearing a good support bra or sports bra minimizes pain and swelling.  You may have sexual intercourse when it is comfortable. a. You may drive when you no longer are taking prescription pain medication, you can comfortably wear a seatbelt, and you can safely maneuver your car and apply brakes. b. RETURN TO WORK:  ______________________________________________________________________________________ 10. You should see your doctor in the office for a follow-up appointment approximately two weeks after your surgery.  Your doctor's nurse will typically make your follow-up appointment when she calls you with your pathology report.  Expect your pathology report 2-3 business days after your surgery.  You may call to check if you do not hear from us after three days. 11. OTHER INSTRUCTIONS: _______________________________________________________________________________________________ _____________________________________________________________________________________________________________________________________ _____________________________________________________________________________________________________________________________________ _____________________________________________________________________________________________________________________________________  WHEN TO CALL YOUR DOCTOR: 1. Fever over 101.0 2. Nausea and/or vomiting. 3. Extreme swelling or bruising. 4. Continued bleeding from incision. 5. Increased pain, redness, or drainage from the incision.  The clinic staff is available to answer your questions during regular business hours.  Please don't  hesitate to call and ask to speak to one of the nurses for clinical concerns.  If you have a medical emergency, go   to the nearest emergency room or call 911.  A surgeon from [facility] is always on call at the hospital.  [address], [address], [place], [place]  [address] ?  [address] [address], [place], [place]   [address]                           [phone] ? [phone] ? FAX [phone] Web site: [email or web address]  

## 2011-10-24 ENCOUNTER — Encounter (HOSPITAL_COMMUNITY): Payer: Self-pay | Admitting: Surgery

## 2011-10-26 ENCOUNTER — Other Ambulatory Visit: Payer: Self-pay | Admitting: Obstetrics

## 2011-10-31 ENCOUNTER — Telehealth (INDEPENDENT_AMBULATORY_CARE_PROVIDER_SITE_OTHER): Payer: Self-pay | Admitting: General Surgery

## 2011-10-31 NOTE — Telephone Encounter (Signed)
Message copied by Liliana Cline on Wed Oct 31, 2011  3:12 PM ------      Message from: Currie Paris      Created: Wed Oct 31, 2011  3:03 PM       Tell her path is benign and as expected

## 2011-10-31 NOTE — Telephone Encounter (Signed)
Patient made aware of path results. Will follow up at appt and call with any questions prior.  

## 2011-11-01 ENCOUNTER — Ambulatory Visit (INDEPENDENT_AMBULATORY_CARE_PROVIDER_SITE_OTHER): Payer: Managed Care, Other (non HMO) | Admitting: General Surgery

## 2011-11-01 ENCOUNTER — Encounter (INDEPENDENT_AMBULATORY_CARE_PROVIDER_SITE_OTHER): Payer: Self-pay | Admitting: General Surgery

## 2011-11-01 VITALS — BP 128/80 | HR 72 | Temp 98.1°F | Resp 14 | Ht 66.0 in | Wt 148.2 lb

## 2011-11-01 DIAGNOSIS — N6452 Nipple discharge: Secondary | ICD-10-CM

## 2011-11-01 DIAGNOSIS — N6459 Other signs and symptoms in breast: Secondary | ICD-10-CM

## 2011-11-01 NOTE — Progress Notes (Signed)
HISTORY: Pt is doing Ok since breast biopsy this week.  She has, however, had a bit of wound separation that is concerning to her.  She denies fevers/ chills.  She is having minimal pain.      EXAM: General:  A&O times 3 Incision:  Without signs of infection.  Upper portion of wound edges separated.  Dermabond layer is very thin and appears to have pulled apart.     PATHOLOGY: 1. 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 - NO EVIDENCE OF MALIGNANCY. 2. Breast, biopsy, Right inferior - BENIGN DUCTAL PAPILLOMA. 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 - FIBROCYSTIC CHANGES WITH FOCAL USUAL DUCTAL HYPERPLASIA WITHOUT ATYPIA. - NO EVIDENCE OF MALIGNANCY.   ASSESSMENT AND PLAN:   Nipple discharge Wound has no sign of infection. Some dehiscence of upper portion of wound.   Steristrips placed to reinforce.  Follow up as scheduled with Dr. Jamey Ripa.        Maudry Diego, MD Surgical Oncology, General & Endocrine Surgery [facility]  No primary provider on file. No ref. provider found

## 2011-11-01 NOTE — Patient Instructions (Signed)
OK to shower.  Follow up as instructed with Dr. Jamey Ripa.    Call if fevers/ chills/increased pain develop.

## 2011-11-01 NOTE — Assessment & Plan Note (Signed)
Wound has no sign of infection. Some dehiscence of upper portion of wound.   Steristrips placed to reinforce.  Follow up as scheduled with Dr. Jamey Ripa.

## 2011-11-15 ENCOUNTER — Ambulatory Visit (INDEPENDENT_AMBULATORY_CARE_PROVIDER_SITE_OTHER): Payer: Managed Care, Other (non HMO) | Admitting: Surgery

## 2011-11-15 ENCOUNTER — Encounter (INDEPENDENT_AMBULATORY_CARE_PROVIDER_SITE_OTHER): Payer: Self-pay | Admitting: Surgery

## 2011-11-15 VITALS — BP 128/74 | HR 76 | Temp 99.1°F | Resp 14 | Ht 66.0 in | Wt 147.8 lb

## 2011-11-15 DIAGNOSIS — Z9889 Other specified postprocedural states: Secondary | ICD-10-CM

## 2011-11-15 NOTE — Progress Notes (Signed)
NAME: April Newton                                            DOB: 1980-07-30 DATE: 11/15/2011                                                  MRN: [id number]  CC: Post op   HPI: This patient comes in for post op follow-up.Sheunderwent Right excisional breast biopsy for a papilloma  on 10/23/11. She feels that she is doing well.  PE:  VITAL SIGNS: BP 128/74  Pulse 76  Temp 99.1 F (37.3 C) (Temporal)  Resp 14  Ht 5\' 6"  (1.676 m)  Wt 147 lb 12.8 oz (67.042 kg)  BMI 23.86 kg/m2  General: The patient appears to be healthy, NAD Incision is completely healed. No evidence of any problems.  DATA REVIEWED: Pathology confirmed intraductal papilloma. I taken a second piece out which also showed papilloma. The common and indicated that there is papilloma at the margin but I believe this margin was the area adjacent to the previously excised portion.  IMPRESSION: The patient is doing well S/P Excisional breast biopsy.    PLAN: Return when necessary. I gave her a copy of the pathology report and discuss it with her.

## 2011-11-15 NOTE — Patient Instructions (Signed)
We will see you again on an as needed basis. Please call the office at [phone] if you have any questions or concerns. Thank you for allowing us to take care of you.  

## 2012-08-21 ENCOUNTER — Other Ambulatory Visit: Payer: Self-pay | Admitting: Obstetrics

## 2012-08-27 ENCOUNTER — Other Ambulatory Visit: Payer: Self-pay | Admitting: Obstetrics

## 2012-08-28 ENCOUNTER — Telehealth: Payer: Self-pay | Admitting: *Deleted

## 2012-08-28 NOTE — Telephone Encounter (Signed)
Called patient and advised that her Fioricet was called to her pharmacy and will be ready for pick up.

## 2012-12-05 ENCOUNTER — Other Ambulatory Visit: Payer: Self-pay | Admitting: Obstetrics

## 2012-12-16 IMAGING — MG MM DIGITAL DIAGNOSTIC BILAT
4 series · 4 of 4 positions shown · non-contrast
Comparison: None.

CLINICAL DATA: 1-week history of spontaneous right nipple
discharge

DIGITAL DIAGNOSTIC BILATERAL MAMMOGRAM WITH CAD

[R CC]
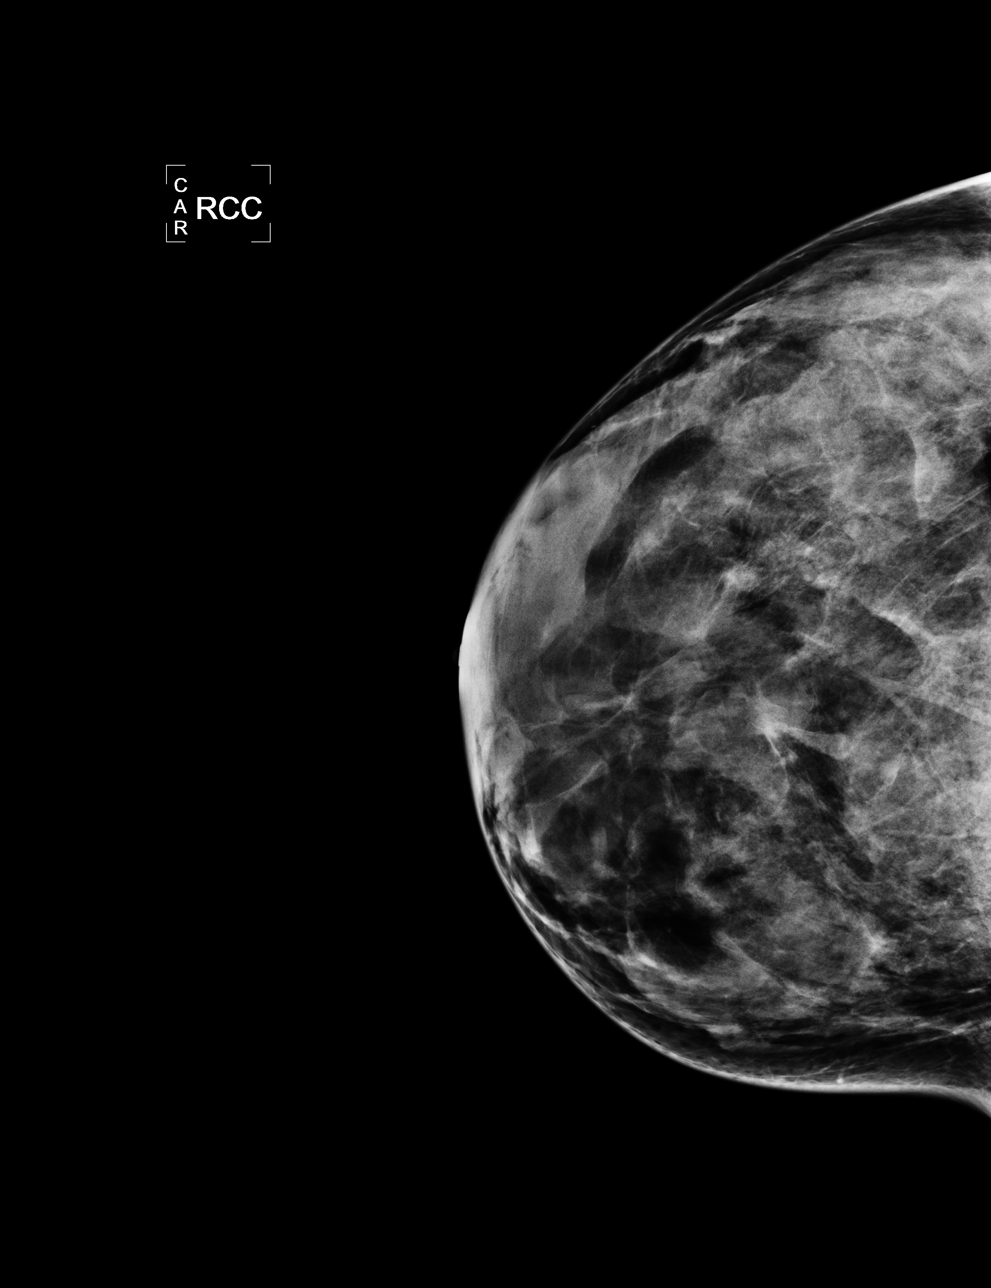

[L CC]
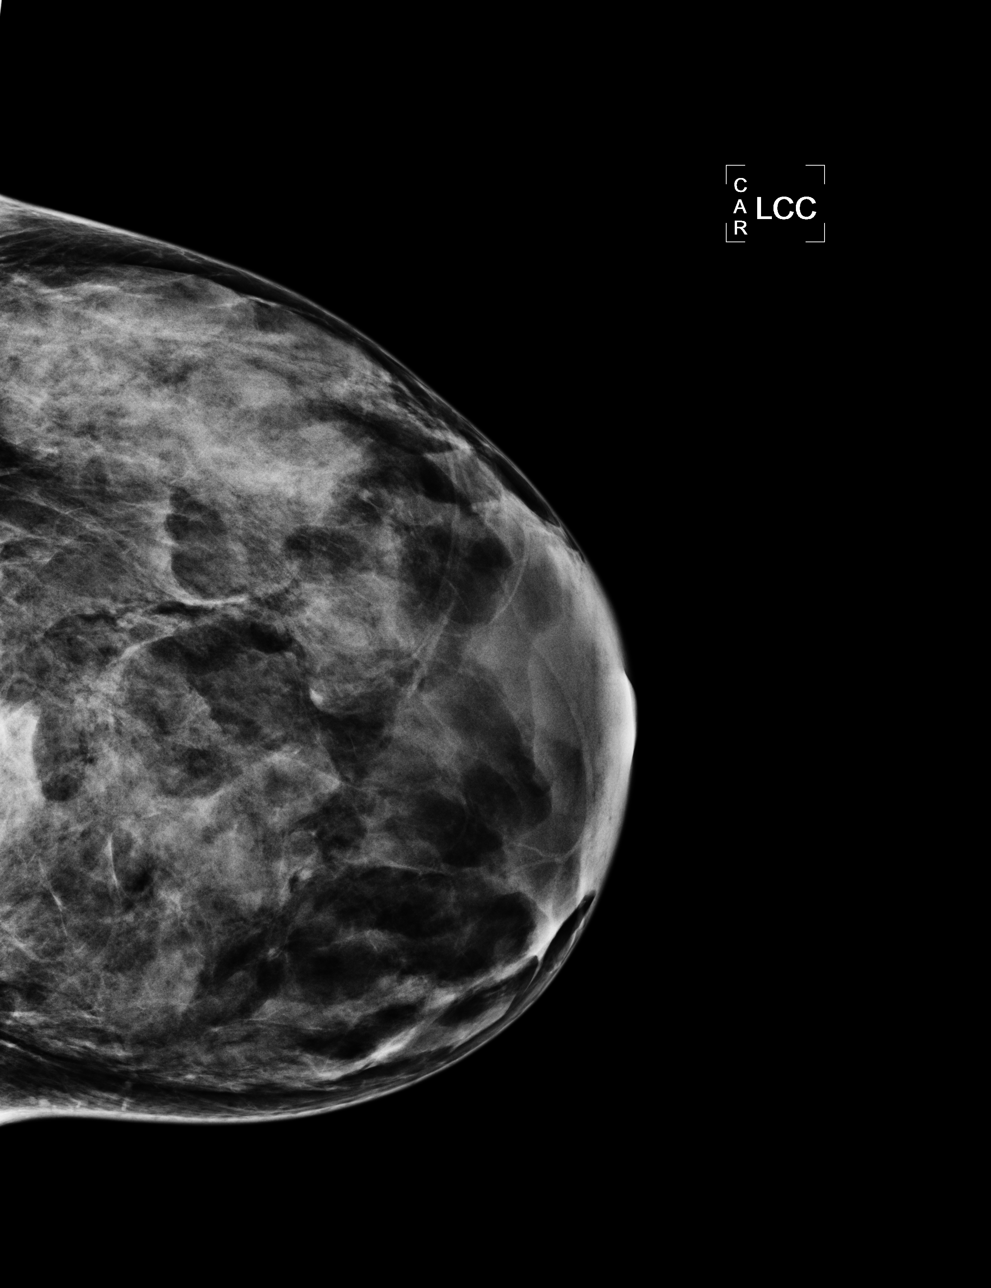

[L MLO]
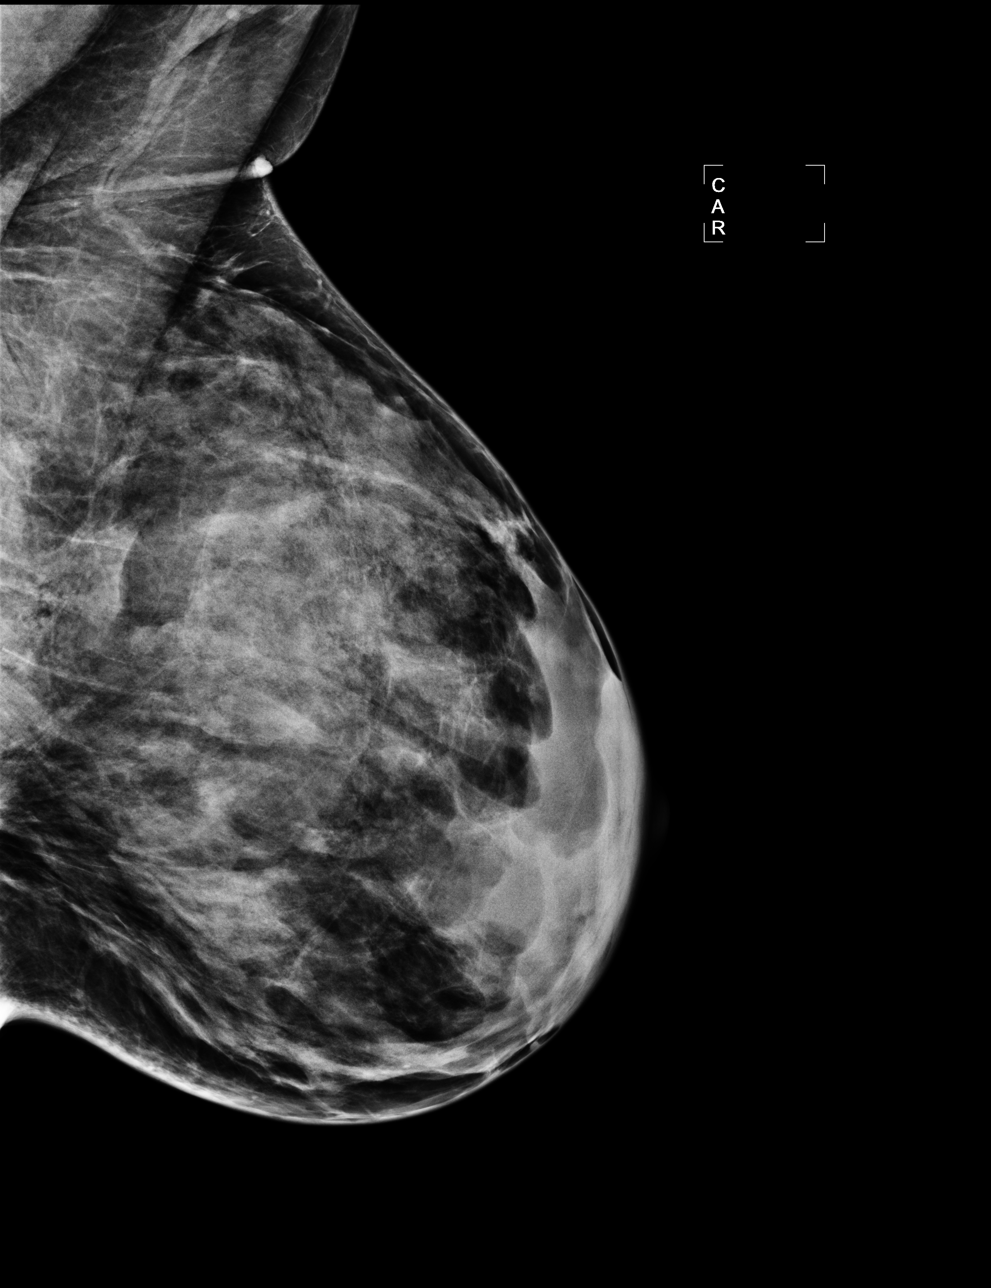

[R MLO]
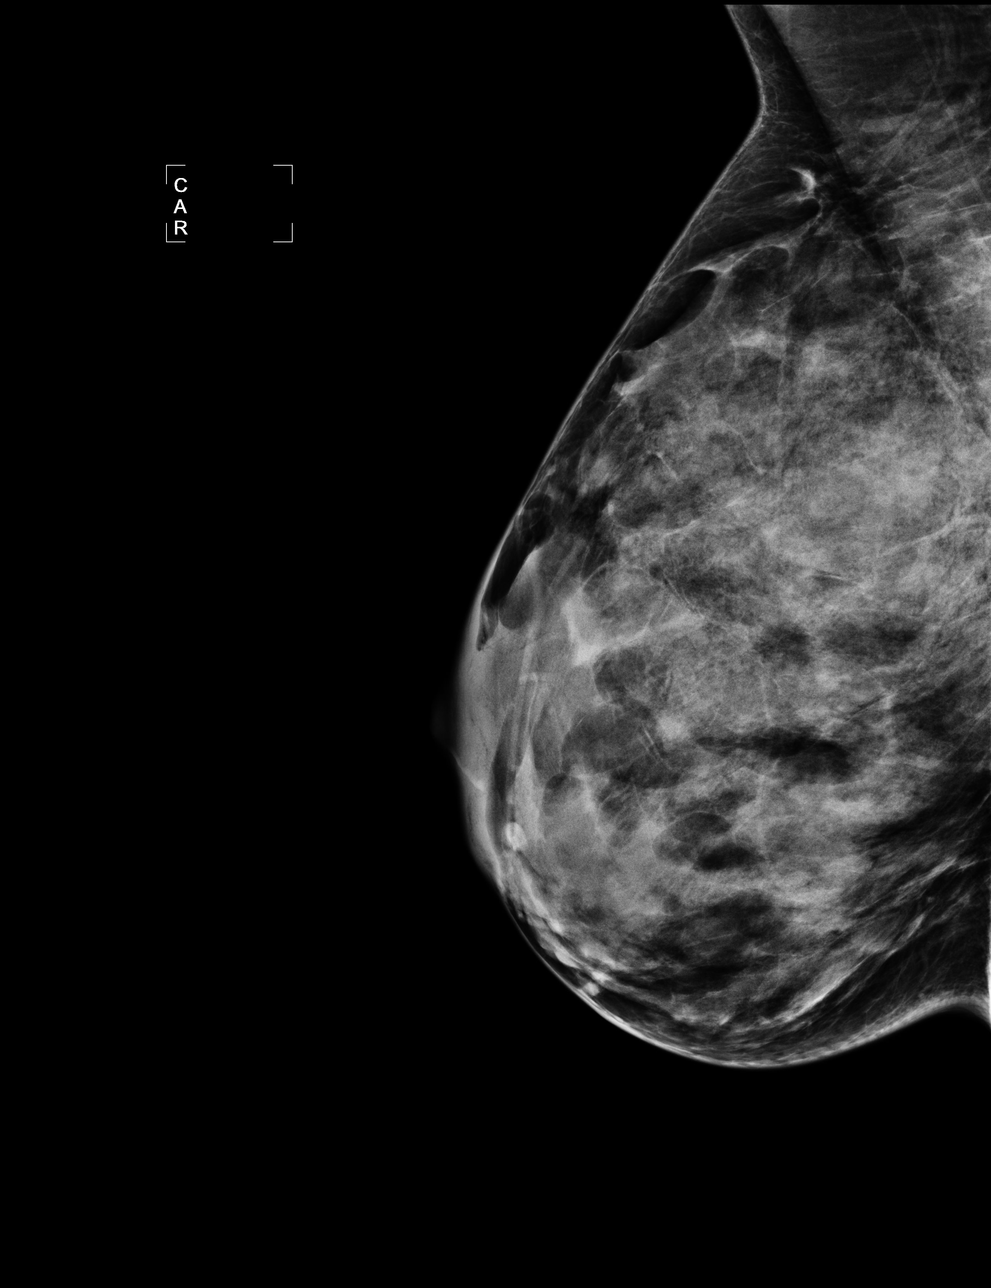

[4 of 4 positions shown; findings below may reference images not displayed]

FINDINGS: The breast tissue is heterogeneously dense.  No mass or
malignant type calcifications are seen.  Specifically, no mass is
seen in the right subareolar region.
Mammographic images were processed with CAD.

No mass is palpated in the right breast.  Yellowish, blood-tinged
discharge is elicited from a single duct at the 2 o'clock position
of the right nipple with gentle expression.
IMPRESSION: Right nipple discharge for which a ductogram is done and reported
separately.  No mammographic evidence of malignancy, left breast.

BI-RADS CATEGORY 0:  Incomplete.  Need additional imaging
evaluation and/or prior mammograms for comparison.

## 2013-02-02 ENCOUNTER — Other Ambulatory Visit: Payer: Self-pay | Admitting: Obstetrics

## 2013-03-09 ENCOUNTER — Other Ambulatory Visit: Payer: Self-pay | Admitting: Obstetrics

## 2013-03-09 NOTE — Telephone Encounter (Signed)
Please review

## 2013-06-01 ENCOUNTER — Other Ambulatory Visit: Payer: Self-pay | Admitting: Obstetrics

## 2013-06-01 NOTE — Telephone Encounter (Signed)
Please Review

## 2013-06-08 ENCOUNTER — Encounter: Payer: Self-pay | Admitting: Obstetrics

## 2013-06-29 ENCOUNTER — Other Ambulatory Visit: Payer: Self-pay | Admitting: Obstetrics

## 2013-07-21 ENCOUNTER — Encounter: Payer: Self-pay | Admitting: Obstetrics

## 2013-09-01 ENCOUNTER — Encounter: Payer: Self-pay | Admitting: Obstetrics

## 2015-10-06 DIAGNOSIS — F410 Panic disorder [episodic paroxysmal anxiety] without agoraphobia: Secondary | ICD-10-CM | POA: Diagnosis not present

## 2015-10-06 DIAGNOSIS — F4322 Adjustment disorder with anxiety: Secondary | ICD-10-CM | POA: Diagnosis not present

## 2015-12-19 DIAGNOSIS — Z7689 Persons encountering health services in other specified circumstances: Secondary | ICD-10-CM | POA: Diagnosis not present

## 2015-12-19 DIAGNOSIS — R030 Elevated blood-pressure reading, without diagnosis of hypertension: Secondary | ICD-10-CM | POA: Diagnosis not present

## 2015-12-21 DIAGNOSIS — F419 Anxiety disorder, unspecified: Secondary | ICD-10-CM | POA: Diagnosis not present

## 2015-12-21 DIAGNOSIS — Z0130 Encounter for examination of blood pressure without abnormal findings: Secondary | ICD-10-CM | POA: Diagnosis not present

## 2015-12-26 ENCOUNTER — Ambulatory Visit: Payer: Self-pay | Admitting: Obstetrics

## 2016-01-02 ENCOUNTER — Ambulatory Visit: Payer: Self-pay | Admitting: Obstetrics

## 2016-01-13 ENCOUNTER — Other Ambulatory Visit: Payer: Self-pay | Admitting: Family Medicine

## 2016-01-13 DIAGNOSIS — N6452 Nipple discharge: Secondary | ICD-10-CM

## 2016-01-23 ENCOUNTER — Ambulatory Visit
Admission: RE | Admit: 2016-01-23 | Discharge: 2016-01-23 | Disposition: A | Discharge status: Home / Self Care | Payer: BLUE CROSS/BLUE SHIELD | Source: Ambulatory Visit | Attending: Family Medicine | Admitting: Family Medicine

## 2016-01-23 DIAGNOSIS — N6452 Nipple discharge: Secondary | ICD-10-CM

## 2016-01-23 DIAGNOSIS — R928 Other abnormal and inconclusive findings on diagnostic imaging of breast: Secondary | ICD-10-CM | POA: Diagnosis not present

## 2016-01-23 DIAGNOSIS — N6489 Other specified disorders of breast: Secondary | ICD-10-CM | POA: Diagnosis not present

## 2016-04-03 DIAGNOSIS — F411 Generalized anxiety disorder: Secondary | ICD-10-CM | POA: Diagnosis not present

## 2016-04-03 DIAGNOSIS — F410 Panic disorder [episodic paroxysmal anxiety] without agoraphobia: Secondary | ICD-10-CM | POA: Diagnosis not present

## 2016-09-15 DIAGNOSIS — F411 Generalized anxiety disorder: Secondary | ICD-10-CM | POA: Diagnosis not present

## 2016-09-15 DIAGNOSIS — F410 Panic disorder [episodic paroxysmal anxiety] without agoraphobia: Secondary | ICD-10-CM | POA: Diagnosis not present

## 2016-09-21 ENCOUNTER — Ambulatory Visit: Payer: Self-pay | Admitting: Physician Assistant

## 2016-09-21 ENCOUNTER — Encounter: Payer: Self-pay | Admitting: Physician Assistant

## 2016-09-21 VITALS — BP 140/90 | HR 100 | Temp 98.4°F

## 2016-09-21 DIAGNOSIS — J0100 Acute maxillary sinusitis, unspecified: Secondary | ICD-10-CM

## 2016-09-21 MED ORDER — FLUCONAZOLE 150 MG PO TABS: ORAL_TABLET | ORAL | 0 refills | Status: AC

## 2016-09-21 MED ORDER — CEFDINIR 300 MG PO CAPS: 300.0000 mg | ORAL_CAPSULE | Freq: Two times a day (BID) | ORAL | 0 refills | Status: AC

## 2016-09-21 MED ORDER — FLUTICASONE PROPIONATE 50 MCG/ACT NA SUSP: 2.0000 | Freq: Every day | NASAL | 6 refills | Status: AC

## 2016-09-21 NOTE — Progress Notes (Signed)
S: C/o runny nose and congestion, sinus pain for 3 days, no fever, chills, cp/sob, v/d; mucus is green and thick, cough is sporadic, c/o of facial and dental pain. Sx are worsening quickly, has been under a lot of stress bc her son had surgery  Using otc meds:   O: PE: vitals wnl, nad, perrl eomi, normocephalic, tms dull, nasal mucosa red and swollen, throat injected, neck supple no lymph, lungs c t a, cv rrr, neuro intact  A:  Acute sinusitis   P: drink fluids, continue regular meds , use otc meds of choice, return if not improving in 5 days, return earlier if worsening, omnicef, diflucan, flonase

## 2016-10-23 ENCOUNTER — Ambulatory Visit: Payer: Self-pay | Admitting: Family

## 2016-10-23 VITALS — BP 140/90 | HR 125 | Temp 98.1°F

## 2016-10-23 DIAGNOSIS — R609 Edema, unspecified: Secondary | ICD-10-CM

## 2016-10-23 DIAGNOSIS — I8390 Asymptomatic varicose veins of unspecified lower extremity: Secondary | ICD-10-CM

## 2016-10-23 DIAGNOSIS — R000 Tachycardia, unspecified: Secondary | ICD-10-CM

## 2016-10-23 NOTE — Progress Notes (Signed)
S/: April Newton is 36 years old and here today due to lower extremity edema worse in the evening after being on her feet resolving with compression hose and elevation. She has varicosities and no leg pain. Her lifestyle is sedentary and she is on her feet all day. She has started to go to the gym and reduce sodium in her diet.. With regards to her tachycardia she states that she has extreme anxiety with medical professionals and that her pulse will be under 100 upon return to her work. She has no history of cardiac renal or thyroid issues.  O/ vital signs noted neck supple without thyromegaly heart tachycardia without murmur or gallops lungs are clear lower extremities with prominent varicose veins ,no edema pedal pulses are good, no leg tenderness A/: Edema, tachycardia, varicosities P/: Does not sound like she has cardiac or renal symptoms but the edema is dependent related to her venous issues. Rx is given for fitting of compression stockings. Labs are obtained for thyroid profile, CBC CMP and urine microalbumin. She is encouraged to continue with lifestyle modification and to follow-up with her PCP.

## 2016-10-24 DIAGNOSIS — M79604 Pain in right leg: Secondary | ICD-10-CM | POA: Diagnosis not present

## 2016-10-24 DIAGNOSIS — M79605 Pain in left leg: Secondary | ICD-10-CM | POA: Diagnosis not present

## 2016-10-24 DIAGNOSIS — R2243 Localized swelling, mass and lump, lower limb, bilateral: Secondary | ICD-10-CM | POA: Diagnosis not present

## 2016-10-24 DIAGNOSIS — R635 Abnormal weight gain: Secondary | ICD-10-CM | POA: Diagnosis not present

## 2017-03-02 DIAGNOSIS — F411 Generalized anxiety disorder: Secondary | ICD-10-CM | POA: Diagnosis not present

## 2017-03-12 ENCOUNTER — Other Ambulatory Visit: Payer: Self-pay

## 2017-03-12 ENCOUNTER — Telehealth: Payer: Self-pay

## 2017-03-12 ENCOUNTER — Emergency Department
Admission: EM | Admit: 2017-03-12 | Discharge: 2017-03-12 | Disposition: A | Discharge status: Home / Self Care | Payer: BLUE CROSS/BLUE SHIELD | Attending: Emergency Medicine | Admitting: Emergency Medicine

## 2017-03-12 DIAGNOSIS — R55 Syncope and collapse: Secondary | ICD-10-CM | POA: Diagnosis not present

## 2017-03-12 DIAGNOSIS — Z79899 Other long term (current) drug therapy: Secondary | ICD-10-CM | POA: Insufficient documentation

## 2017-03-12 LAB — BASIC METABOLIC PANEL
Anion gap: 9 (ref 5–15)
BUN: 17 mg/dL (ref 6–20)
CO2: 26 mmol/L (ref 22–32)
Calcium: 9.3 mg/dL (ref 8.9–10.3)
Chloride: 101 mmol/L (ref 101–111)
Creatinine, Ser: 0.74 mg/dL (ref 0.44–1.00)
GFR calc Af Amer: >60 mL/min (ref >60)
GFR calc non Af Amer: >60 mL/min (ref >60)
Glucose, Bld: 89 mg/dL (ref 65–99)
Potassium: 3.8 mmol/L (ref 3.5–5.1)
Sodium: 136 mmol/L (ref 135–145)

## 2017-03-12 LAB — URINALYSIS, COMPLETE (UACMP) WITH MICROSCOPIC
Bacteria, UA: NONE SEEN
Bilirubin Urine: NEGATIVE
Glucose, UA: NEGATIVE mg/dL
Ketones, ur: NEGATIVE mg/dL
Leukocytes, UA: NEGATIVE
Nitrite: NEGATIVE
Protein, ur: 100 mg/dL — AB
Specific Gravity, Urine: 1.028 (ref 1.005–1.030)
pH: 6 (ref 5.0–8.0)

## 2017-03-12 LAB — CBC
HCT: 40.3 % (ref 35.0–47.0)
Hemoglobin: 13.3 g/dL (ref 12.0–16.0)
MCH: 30.5 pg (ref 26.0–34.0)
MCHC: 33 g/dL (ref 32.0–36.0)
MCV: 92.4 fL (ref 80.0–100.0)
Platelets: 283 10*3/uL (ref 150–440)
RBC: 4.36 MIL/uL (ref 3.80–5.20)
RDW: 13.2 % (ref 11.5–14.5)
WBC: 3.2 10*3/uL — ABNORMAL LOW (ref 3.6–11.0)

## 2017-03-12 LAB — GLUCOSE, CAPILLARY: Glucose-Capillary: 80 mg/dL (ref 65–99)

## 2017-03-12 LAB — POCT PREGNANCY, URINE: Preg Test, Ur: NEGATIVE

## 2017-03-12 MED ORDER — SODIUM CHLORIDE 0.9 % IV BOLUS (SEPSIS)
1000.0000 mL | Freq: Once | INTRAVENOUS | Status: AC
  Administered 2017-03-12: 1000 mL via INTRAVENOUS

## 2017-03-12 NOTE — ED Triage Notes (Signed)
Pt at work, had syncopal episode from standing. Did hit her head. Witnessed fall. Pt approx passed out for 1 minute. Brought over via stretcher from [facility] where patient works. Pt alert and oriented X4 at this time.

## 2017-03-12 NOTE — ED Notes (Signed)
Pt alert and oriented X4, active, cooperative, pt in NAD. RR even and unlabored, color WNL.  Pt informed to return if any life threatening symptoms occur.  Discharge and followup instructions reviewed.  

## 2017-03-12 NOTE — ED Provider Notes (Signed)
[facility] Emergency Department Provider Note ____________________________________________   First MD Initiated Contact with Patient 03/12/17 1225     (approximate)  I have reviewed the triage vital signs and the nursing notes.   HISTORY  Chief Complaint Loss of Consciousness    HPI April Newton is a 36 y.o. 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  Patient states she was in her usual state of health until this happened.  She states that she did not eat this morning.  Patient denies any associated palpitations or chest pain, difficulty breathing, headache, vomiting, fevers, or weakness.  She states she has had near syncope before but that was more of a feeling of lightheadedness preceding it.  Per bystanders who are medically trained, patient had a heart rate in the 150s directly afterwards.  She did hit her head on a shelf or cabinet when she syncopized.  She was unconscious for approximately 1 minute, and there was no report of any seizure-like activity.  Patient denies urinary incontinence or tongue biting.  Patient states that she did have an episode where her heart was racing and she came to the ER and had to have a medication to have it slowed down; when I ask if the word SVT is familiar, she states that that might have been it.   Past Medical History:  Diagnosis Date  . Anxiety   . Headache(784.0)   . Heart murmur    pt. states she saw a cardiologist in [place]  for her heart murmur. Does not remember his name and has not seen anyone since    There are no active problems to display for this patient.   Past Surgical History:  Procedure Laterality Date  . CESAREAN SECTION  06/15/2001  . CHOLECYSTECTOMY  2003  .  uterine ablasion  2010    Prior to Admission medications   Medication Sig Start Date End Date Taking? Authorizing Provider  ALPRAZolam Duanne Moron) 0.5 MG tablet Take 0.5 mg by mouth 3 (three) times daily as needed.    [provider]  butalbital-acetaminophen-caffeine (FIORICET, ESGIC) [phone] MG per tablet TAKE 2 TABLETS EVERY 6 HOURS AS NEEDED Patient not taking: Reported on 09/21/2016 03/09/13   Shelly Bombard, MD  cefdinir (OMNICEF) 300 MG capsule Take 1 capsule (300 mg total) by mouth 2 (two) times daily. Patient not taking: Reported on 10/23/2016 09/21/16   Versie Starks, PA-C  fluconazole (DIFLUCAN) 150 MG tablet TAKE 1 TABLET (150 MG TOTAL) BY MOUTH ONCE. Patient not taking: Reported on 09/21/2016 02/02/13   Shelly Bombard, MD  fluconazole (DIFLUCAN) 150 MG tablet Take one now and one in a week Patient not taking: Reported on 10/23/2016 09/21/16   Caryn Section Linden Dolin, PA-C  fluticasone [facility]) 50 MCG/ACT nasal spray Place 2 sprays into both nostrils daily. 09/21/16   Fisher, Linden Dolin, PA-C  ibuprofen (ADVIL,MOTRIN) 800 MG tablet TAKE 1 TABLET BY MOUTH EVERY 8 HOURS AS NEEDED    Shelly Bombard, MD  topiramate (TOPAMAX) 50 MG tablet Take 50 mg by mouth daily.    [provider]    Allergies Patient has no known allergies.  Family History  Problem Relation Age of Onset  . Cancer Cousin  breast    Social History Social History   Tobacco Use  . Smoking status: Never Smoker  . Smokeless tobacco: Never Used  Substance Use Topics  . Alcohol use: Yes    Comment: rarely  . Drug use: No    Review of Systems  Constitutional: No fever/chills Eyes: No visual changes. ENT: No sore throat. Cardiovascular: Denies chest pain. Respiratory: Denies shortness of breath. Gastrointestinal: No nausea, no vomiting.  No diarrhea.  Genitourinary: Negative for dysuria.  Musculoskeletal: Negative for back pain. Skin: Negative for rash. Neurological: Negative for  headaches, focal weakness or numbness.   ____________________________________________   PHYSICAL EXAM:  VITAL SIGNS: ED Triage Vitals [03/12/17 1221]  Enc Vitals Group     BP (!) 144/99     Pulse Rate (!) 106     Resp (!) 26     Temp 98.7 F (37.1 C)     Temp Source Oral     SpO2 100 %     Weight 160 lb (72.6 kg)     Height 5\' 6"  (1.676 m)     Head Circumference      Peak Flow      Pain Score      Pain Loc      Pain Edu?      Excl. in [place]?     Constitutional: Alert and oriented. Well appearing and in no acute distress. Eyes: Conjunctivae are normal.  EOMI.  PERRLA.  Head: Atraumatic. Nose: No congestion/rhinnorhea. Mouth/Throat: Mucous membranes are moist.   Neck: Normal range of motion.  Cardiovascular: Tachycardic, regular rhythm. Grossly normal heart sounds.  Good peripheral circulation. Respiratory: Normal respiratory effort.  No retractions. Lungs CTAB. Gastrointestinal: Soft and nontender. No distention.  Genitourinary: No CVA tenderness. Musculoskeletal: No lower extremity edema.  Extremities warm and well perfused.  Neurologic:  Normal speech and language. Motor and sensory intact in all extremities.  Normal coordination.  No gross focal neurologic deficits are appreciated.  Skin:  Skin is warm and dry. No rash noted. Psychiatric: Mood and affect are normal. Speech and behavior are normal.  ____________________________________________   LABS (all labs ordered are listed, but only abnormal results are displayed)  Labs Reviewed  CBC - Abnormal; Notable for the following components:      Result Value   WBC 3.2 (*)    All other components within normal limits  URINALYSIS, COMPLETE (UACMP) WITH MICROSCOPIC - Abnormal; Notable for the following components:   Color, Urine YELLOW (*)    APPearance HAZY (*)    Hgb urine dipstick MODERATE (*)    Protein, ur 100 (*)    Squamous Epithelial / LPF 6-30 (*)    All other components within normal limits  BASIC  METABOLIC PANEL  GLUCOSE, CAPILLARY  CBG MONITORING, ED  POC URINE PREG, ED  POCT PREGNANCY, URINE   ____________________________________________  EKG  ED ECG REPORT I, Arta Silence, the attending physician, personally viewed and interpreted this ECG.  Date: 03/12/2017 EKG Time: 1219 Rate: 113 Rhythm: Sinus tachycardia QRS Axis: normal Intervals: normal ST/T Wave abnormalities: normal Narrative Interpretation: sinus tachycardia without other acute findings; no evidence of WPW, Brugada, abnormal intervals or other possible causes of syncope  ____________________________________________  RADIOLOGY    ____________________________________________   PROCEDURES  Procedure(s) performed: No    Critical Care performed: No ____________________________________________   INITIAL IMPRESSION / ASSESSMENT AND PLAN / ED COURSE  Pertinent labs & imaging results that were available during my care of the patient were reviewed by  me and considered in my medical decision making (see chart for details).  36 year old female with past medical history of anxiety, and possible prior episode of SVT, presents with syncope with a brief feeling of being jittery but no other significant prodrome, and associated with significant tachycardia afterwards.  No seizure-like symptoms.  On exam here, patient is anxious but well-appearing, initially slightly tachycardic but with otherwise normal vital signs, and an otherwise unremarkable exam.  EKG shows sinus tachycardia with no other significant findings.  Past medical records reviewed in Epic and are noncontributory.  Overall differential most likely a vasovagal episode, although the tachycardia makes this slightly less likely, versus possible SVT or other paroxysmal arrhythmia.  There is no evidence of WPW, Brugada, or other concerning cause on the EKG.  Also consider tachycardia related to dehydration or infection that could have precipitated  the episode, though this is less likely given no fever and otherwise normal vital signs.  Plan: Basic labs, fluids, observe and reassess.  If negative workup, likely discharge with close cardiology follow-up.  Clinical Course as of Mar 12 1441  Tue Mar 12, 2017  1357 WBC in range of patient's baseline.   UA with RBCs but no WBCs or bacteria.  No evidence of UTI.  BMP normal.   [SS]    Clinical Course User Index [SS] Arta Silence, MD   ----------------------------------------- 2:40 PM on 03/12/2017 -----------------------------------------  Lab workup unremarkable.  Patient's heart rate now in the 80s-90s.  She states that she feels much better, and would like to go home.  I consulted Dr. and from cardiology and discussed the case with him.  He agrees with discharge with close follow-up, and states that patient can follow-up with him; he will have his scheduler reach out and I will provide contact information in the discharge paperwork.  Return precautions given, and patient expresses understanding.  ____________________________________________   FINAL CLINICAL IMPRESSION(S) / ED DIAGNOSES  Final diagnoses:  Syncope, unspecified syncope type      NEW MEDICATIONS STARTED DURING THIS VISIT:  This SmartLink is deprecated. Use AVSMEDLIST instead to display the medication list for a patient.   Note:  This document was prepared using Dragon voice recognition software and may include unintentional dictation errors.    Arta Silence, MD 03/12/17 1442

## 2017-03-12 NOTE — ED Notes (Signed)
Pt given snack. EDP at bedside for update.

## 2017-03-12 NOTE — Discharge Instructions (Signed)
Return to the ER for new, worsening, or recurrent lightheadedness, passing out, weakness, palpitations or feeling like your heart rate is faster than 100 per minute, chest pain, difficulty breathing, or any other new or worsening symptoms that concern you.  Follow-up with Dr. Saunders Revel from cardiology and with your primary care doctor.

## 2017-03-12 NOTE — Telephone Encounter (Signed)
Pt was seen in ED 03/12/17 She will be a new patient to office Attempted to call patient to schedule appointment   Will try again at a later time

## 2017-03-13 NOTE — Telephone Encounter (Signed)
Pt was seen in ED 03/12/17 She will be a new patient to office Attempted to call patient to schedule appointment   Will try again at a later time

## 2017-03-15 NOTE — Telephone Encounter (Signed)
Pt was seen in ED 03/12/17 She will be a new patient to office Attempted to call patient to schedule appointment   Will try again at a later time

## 2017-08-13 DIAGNOSIS — K146 Glossodynia: Secondary | ICD-10-CM | POA: Diagnosis not present

## 2017-08-17 DIAGNOSIS — F410 Panic disorder [episodic paroxysmal anxiety] without agoraphobia: Secondary | ICD-10-CM | POA: Diagnosis not present

## 2017-09-11 DIAGNOSIS — F4323 Adjustment disorder with mixed anxiety and depressed mood: Secondary | ICD-10-CM | POA: Diagnosis not present

## 2017-09-18 DIAGNOSIS — F4321 Adjustment disorder with depressed mood: Secondary | ICD-10-CM | POA: Diagnosis not present

## 2017-09-18 DIAGNOSIS — F411 Generalized anxiety disorder: Secondary | ICD-10-CM | POA: Diagnosis not present

## 2018-02-15 DIAGNOSIS — F4322 Adjustment disorder with anxiety: Secondary | ICD-10-CM | POA: Diagnosis not present

## 2018-02-15 DIAGNOSIS — F411 Generalized anxiety disorder: Secondary | ICD-10-CM | POA: Diagnosis not present

## 2018-04-12 DIAGNOSIS — F4321 Adjustment disorder with depressed mood: Secondary | ICD-10-CM | POA: Diagnosis not present

## 2018-04-12 DIAGNOSIS — F4322 Adjustment disorder with anxiety: Secondary | ICD-10-CM | POA: Diagnosis not present

## 2018-08-29 DIAGNOSIS — F439 Reaction to severe stress, unspecified: Secondary | ICD-10-CM | POA: Diagnosis not present

## 2018-08-29 DIAGNOSIS — G44201 Tension-type headache, unspecified, intractable: Secondary | ICD-10-CM | POA: Diagnosis not present

## 2018-10-04 DIAGNOSIS — F410 Panic disorder [episodic paroxysmal anxiety] without agoraphobia: Secondary | ICD-10-CM | POA: Diagnosis not present

## 2018-10-21 DIAGNOSIS — R5383 Other fatigue: Secondary | ICD-10-CM | POA: Diagnosis not present

## 2018-12-09 DIAGNOSIS — R509 Fever, unspecified: Secondary | ICD-10-CM | POA: Diagnosis not present

## 2019-04-14 DIAGNOSIS — F4321 Adjustment disorder with depressed mood: Secondary | ICD-10-CM | POA: Diagnosis not present

## 2019-04-14 DIAGNOSIS — F411 Generalized anxiety disorder: Secondary | ICD-10-CM | POA: Diagnosis not present

## 2019-05-14 DIAGNOSIS — F411 Generalized anxiety disorder: Secondary | ICD-10-CM | POA: Diagnosis not present

## 2019-06-12 DIAGNOSIS — Z20822 Contact with and (suspected) exposure to covid-19: Secondary | ICD-10-CM | POA: Diagnosis not present

## 2019-06-12 DIAGNOSIS — M791 Myalgia, unspecified site: Secondary | ICD-10-CM | POA: Diagnosis not present

## 2019-06-12 DIAGNOSIS — R519 Headache, unspecified: Secondary | ICD-10-CM | POA: Diagnosis not present

## 2019-06-12 DIAGNOSIS — R5383 Other fatigue: Secondary | ICD-10-CM | POA: Diagnosis not present

## 2019-08-04 DIAGNOSIS — N6320 Unspecified lump in the left breast, unspecified quadrant: Secondary | ICD-10-CM | POA: Diagnosis not present

## 2019-08-06 ENCOUNTER — Other Ambulatory Visit: Payer: Self-pay | Admitting: Nurse Practitioner

## 2019-08-06 DIAGNOSIS — N6320 Unspecified lump in the left breast, unspecified quadrant: Secondary | ICD-10-CM

## 2019-08-11 ENCOUNTER — Ambulatory Visit: Payer: BLUE CROSS/BLUE SHIELD | Admitting: Obstetrics & Gynecology

## 2019-08-17 ENCOUNTER — Other Ambulatory Visit: Payer: Self-pay

## 2019-08-17 ENCOUNTER — Ambulatory Visit
Admission: RE | Admit: 2019-08-17 | Discharge: 2019-08-17 | Disposition: A | Discharge status: Home / Self Care | Payer: BC Managed Care – PPO | Source: Ambulatory Visit | Attending: Nurse Practitioner | Admitting: Nurse Practitioner

## 2019-08-17 ENCOUNTER — Other Ambulatory Visit: Payer: Self-pay | Admitting: Nurse Practitioner

## 2019-08-17 DIAGNOSIS — N6012 Diffuse cystic mastopathy of left breast: Secondary | ICD-10-CM | POA: Diagnosis not present

## 2019-08-17 DIAGNOSIS — R928 Other abnormal and inconclusive findings on diagnostic imaging of breast: Secondary | ICD-10-CM

## 2019-08-17 DIAGNOSIS — R922 Inconclusive mammogram: Secondary | ICD-10-CM | POA: Diagnosis not present

## 2019-08-17 DIAGNOSIS — N6320 Unspecified lump in the left breast, unspecified quadrant: Secondary | ICD-10-CM

## 2019-08-17 DIAGNOSIS — N6011 Diffuse cystic mastopathy of right breast: Secondary | ICD-10-CM | POA: Diagnosis not present

## 2019-08-24 ENCOUNTER — Other Ambulatory Visit: Payer: Self-pay

## 2019-08-26 ENCOUNTER — Other Ambulatory Visit: Payer: Self-pay

## 2019-08-26 ENCOUNTER — Ambulatory Visit
Admission: RE | Admit: 2019-08-26 | Discharge: 2019-08-26 | Disposition: A | Discharge status: Home / Self Care | Payer: BC Managed Care – PPO | Source: Ambulatory Visit | Attending: Nurse Practitioner | Admitting: Nurse Practitioner

## 2019-08-26 DIAGNOSIS — N6320 Unspecified lump in the left breast, unspecified quadrant: Secondary | ICD-10-CM

## 2019-08-26 DIAGNOSIS — N6002 Solitary cyst of left breast: Secondary | ICD-10-CM | POA: Diagnosis not present

## 2019-11-28 DIAGNOSIS — F4322 Adjustment disorder with anxiety: Secondary | ICD-10-CM | POA: Diagnosis not present

## 2019-11-28 DIAGNOSIS — F411 Generalized anxiety disorder: Secondary | ICD-10-CM | POA: Diagnosis not present

## 2019-12-29 ENCOUNTER — Other Ambulatory Visit: Payer: Self-pay

## 2019-12-29 ENCOUNTER — Other Ambulatory Visit: Payer: BC Managed Care – PPO

## 2019-12-29 DIAGNOSIS — Z20822 Contact with and (suspected) exposure to covid-19: Secondary | ICD-10-CM

## 2019-12-30 LAB — NOVEL CORONAVIRUS, NAA: SARS-CoV-2, NAA: NOT DETECTED

## 2019-12-30 LAB — SARS-COV-2, NAA 2 DAY TAT

## 2020-01-15 ENCOUNTER — Other Ambulatory Visit: Payer: BC Managed Care – PPO

## 2020-01-15 DIAGNOSIS — M791 Myalgia, unspecified site: Secondary | ICD-10-CM | POA: Diagnosis not present

## 2020-01-15 DIAGNOSIS — Z03818 Encounter for observation for suspected exposure to other biological agents ruled out: Secondary | ICD-10-CM | POA: Diagnosis not present

## 2020-01-15 DIAGNOSIS — R0981 Nasal congestion: Secondary | ICD-10-CM | POA: Diagnosis not present

## 2020-01-15 DIAGNOSIS — R5383 Other fatigue: Secondary | ICD-10-CM | POA: Diagnosis not present

## 2020-05-13 DIAGNOSIS — F411 Generalized anxiety disorder: Secondary | ICD-10-CM | POA: Diagnosis not present

## 2020-11-05 IMAGING — US US BREAST*R* LIMITED INC AXILLA
1 series · 8 of 8 positions shown · non-contrast
Comparison: Previous exam(s).

CLINICAL DATA: Patient complains of a palpable left breast mass.

EXAM:
DIGITAL DIAGNOSTIC BILATERAL MAMMOGRAM WITH CAD AND TOMO
ULTRASOUND BILATERAL BREAST

[Series 1: us breast*right* limited inc axilla · 0.06mm/px · 8 of 8 slices shown]
[im 1/8]
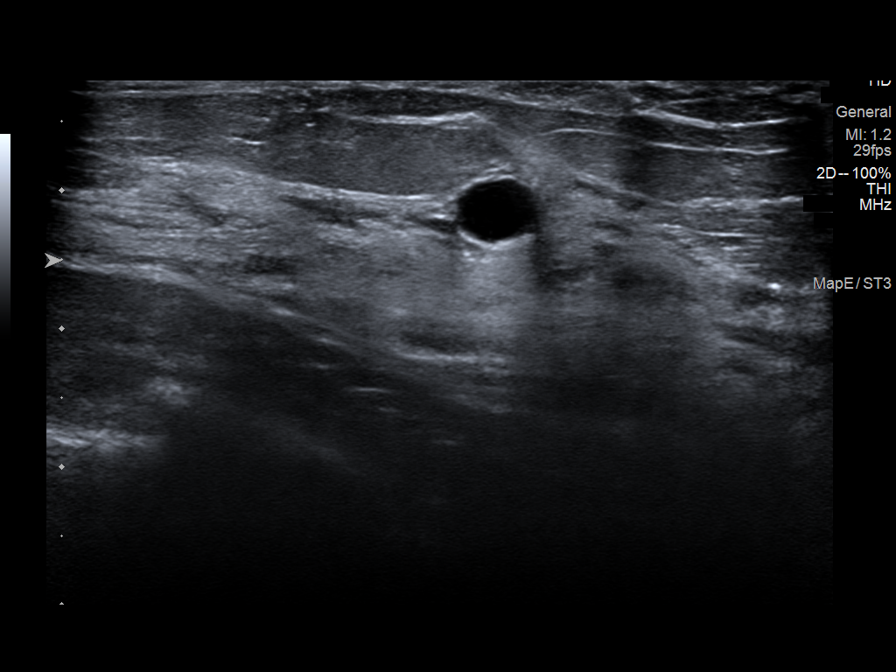
[im 2/8]
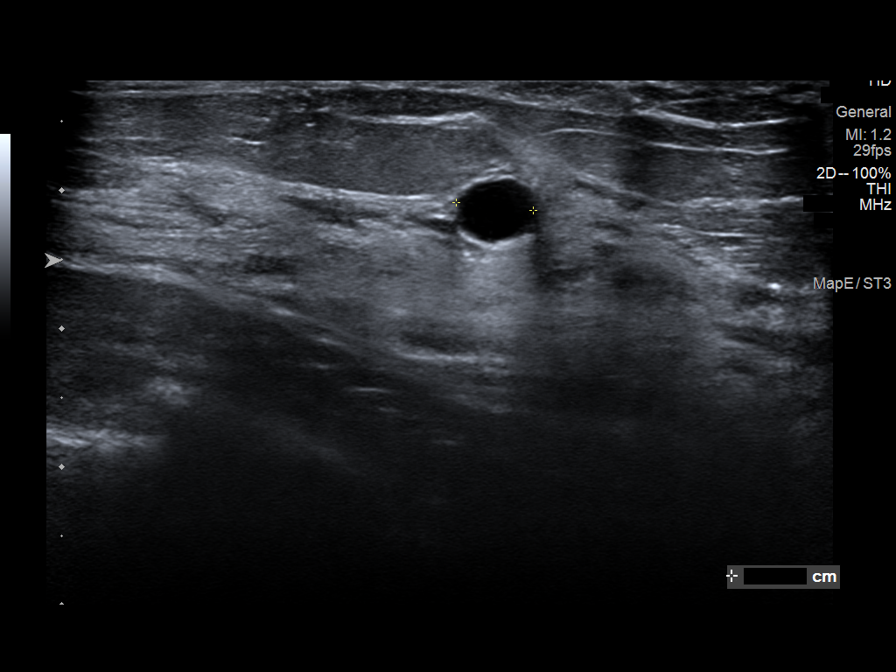
[im 3/8]
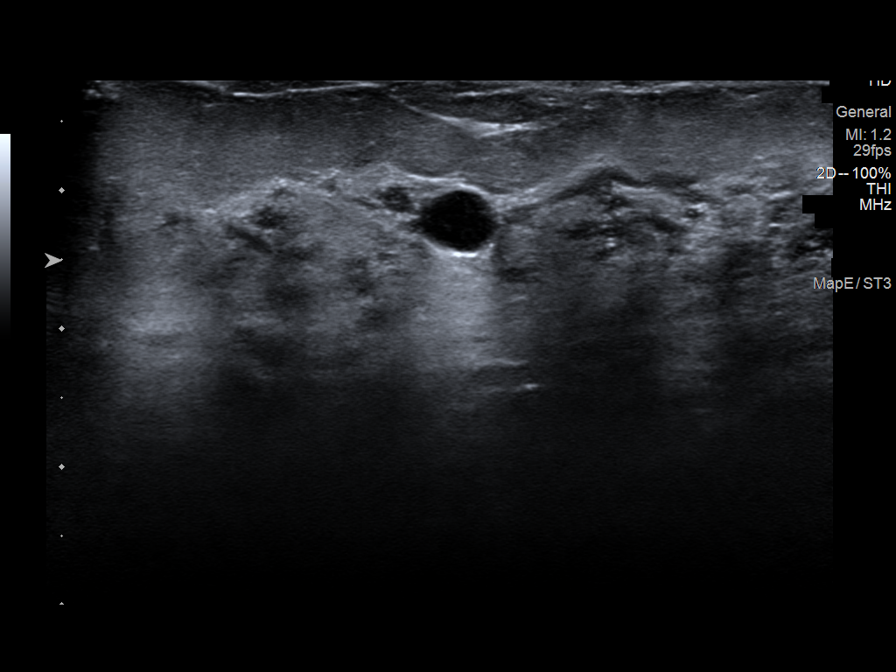
[im 4/8]
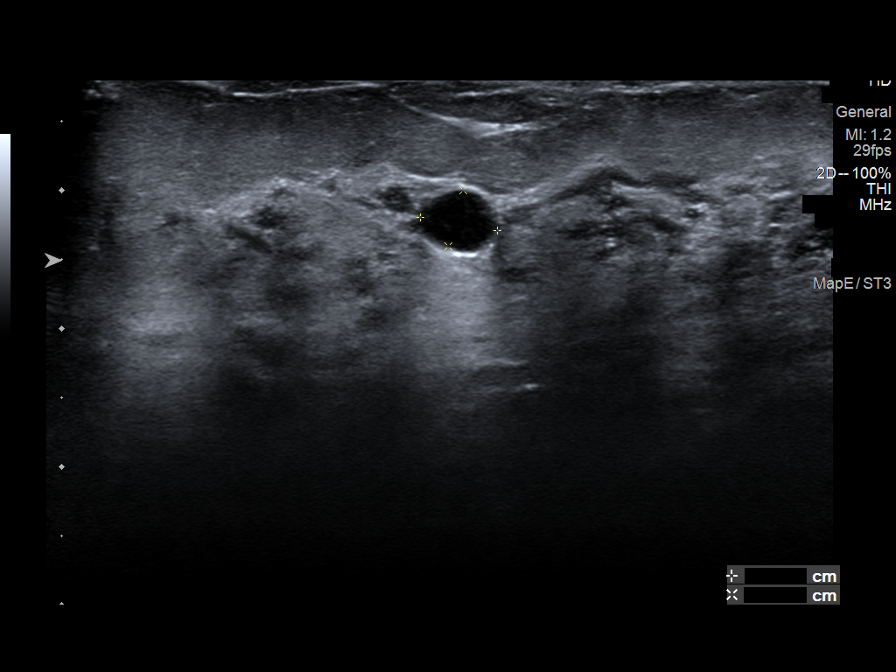
[im 5/8]
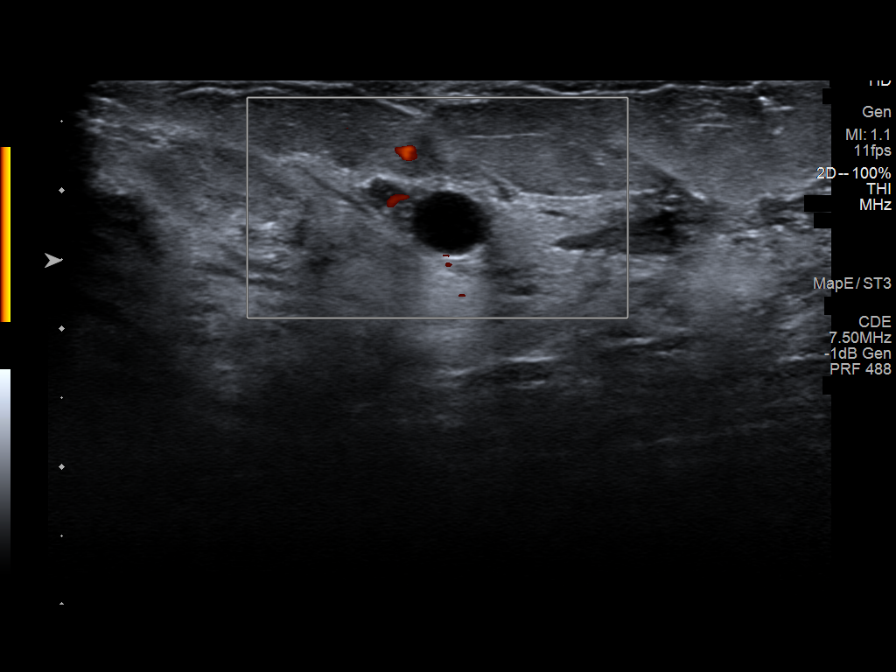
[im 6/8]
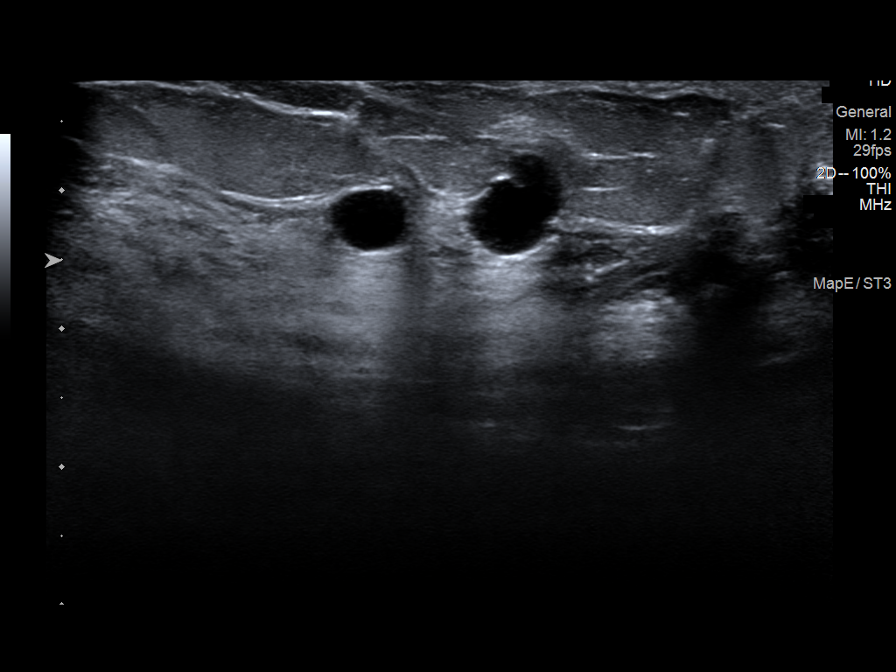
[im 7/8]
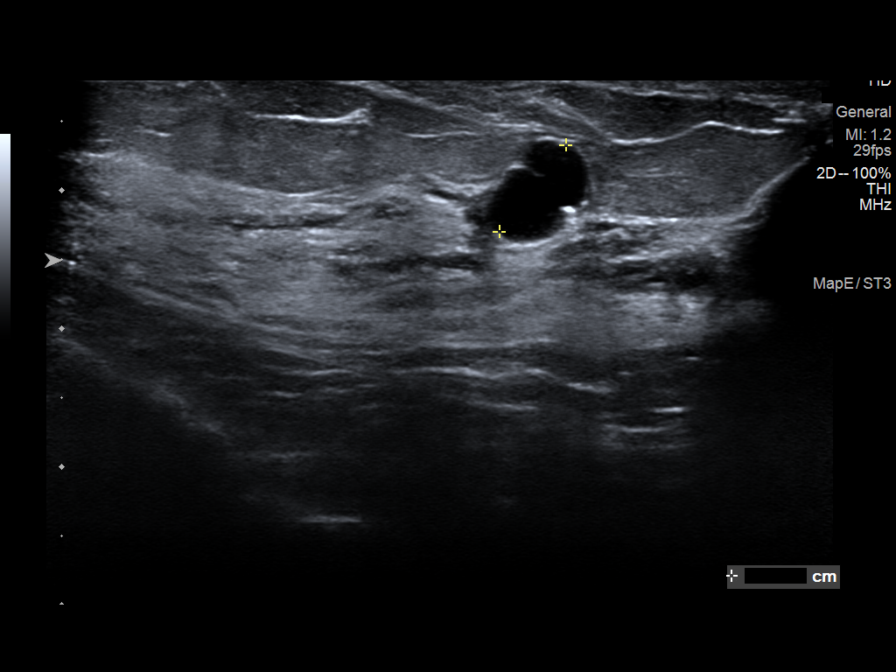
[im 8/8]
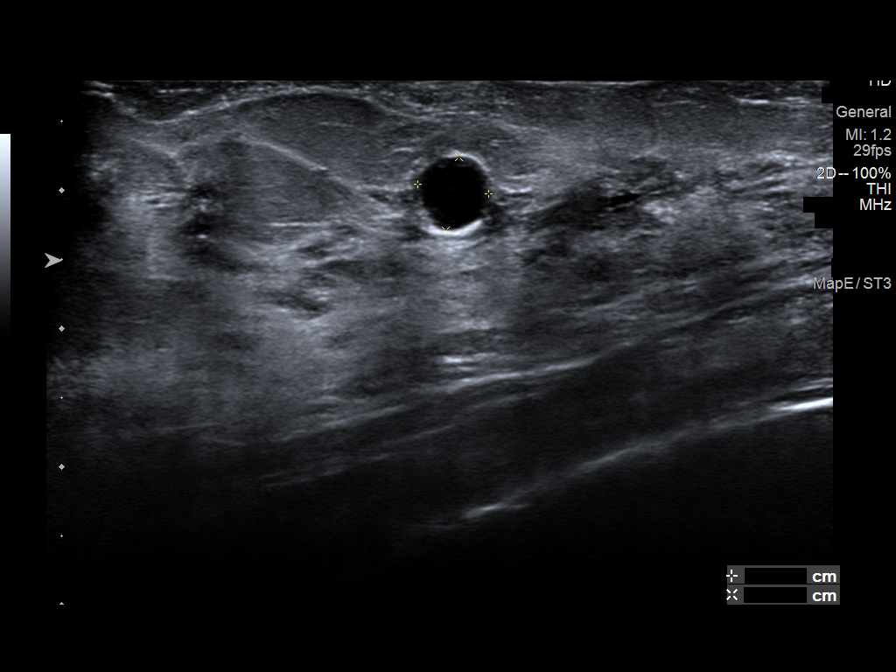

[8 of 8 positions shown; findings below may reference images not displayed]

ACR Breast Density Category c: The breast tissue is heterogeneously
dense, which may obscure small masses.
FINDINGS: There is a 5 mm well-circumscribed mass in the medial aspect of the
right breast and a well-circumscribed 2.4 cm mass in the upper
aspect of the left breast. There are no malignant type
microcalcifications in either breast.

Mammographic images were processed with CAD.

Targeted ultrasound is performed, showing a well-circumscribed
anechoic cyst in left breast 11 o'clock 4 cm from the nipple
measuring 2.2 x 2.1 x 1.8 cm. Sonographic evaluation of the right
breast shows 2 adjacent anechoic cysts at 2 o'clock 3 cm from the
nipple measuring 0.6 x 0.6 x 0.4 cm and 0.8 x 0.5 x 0.5 cm. No solid
mass identified in either breast.
IMPRESSION: Bilateral breast cysts.  No evidence of malignancy in either breast.

RECOMMENDATION:
Bilateral screening mammogram in 1 year is recommended. The patient
states the left breast cyst is associated with pain therefore
aspiration will be scheduled at the patient's convenience.

I have discussed the findings and recommendations with the patient.
If applicable, a reminder letter will be sent to the patient
regarding the next appointment.

BI-RADS CATEGORY  2: Benign.

## 2020-11-05 IMAGING — US US BREAST*L* LIMITED INC AXILLA
1 series · 5 of 5 positions shown · non-contrast
Comparison: Previous exam(s).

CLINICAL DATA: Patient complains of a palpable left breast mass.

EXAM:
DIGITAL DIAGNOSTIC BILATERAL MAMMOGRAM WITH CAD AND TOMO
ULTRASOUND BILATERAL BREAST

[Series 1: us breast*left* limited inc axilla · 0.07mm/px · 5 of 5 slices shown]
[im 1/5]
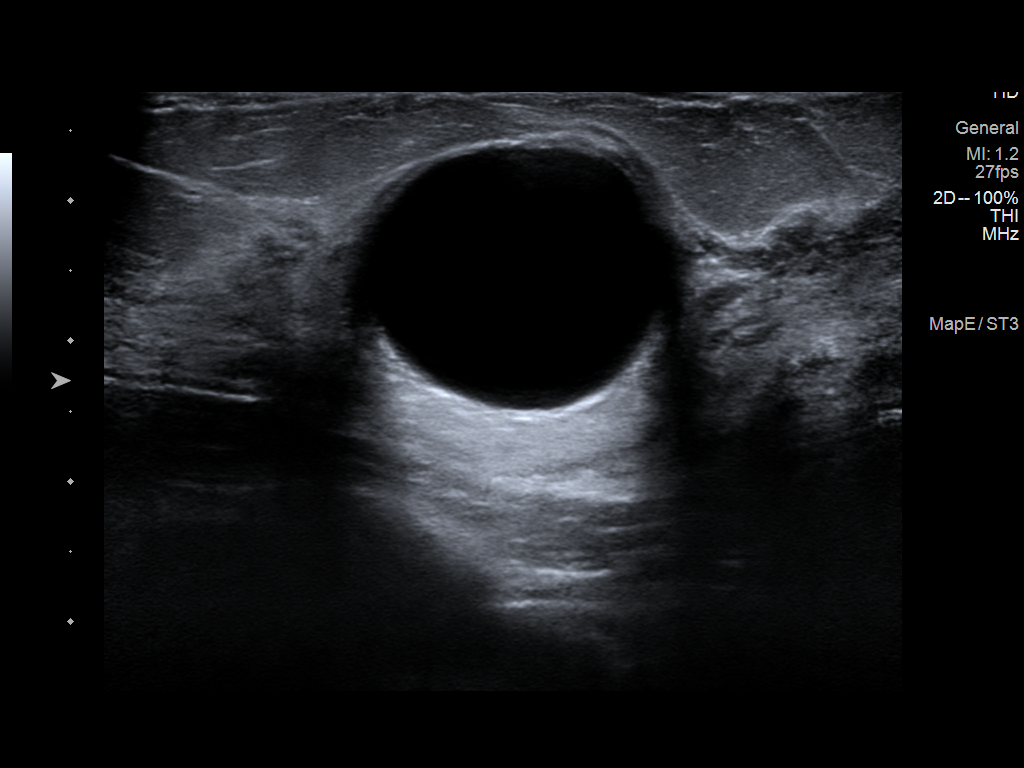
[im 2/5]
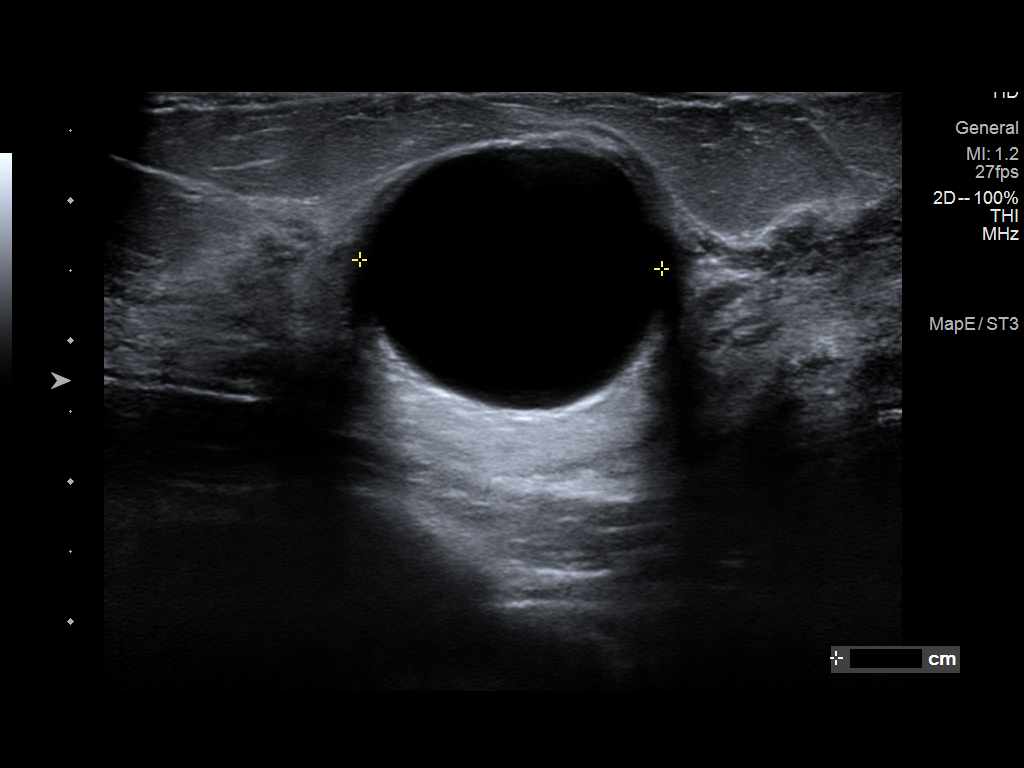
[im 3/5]
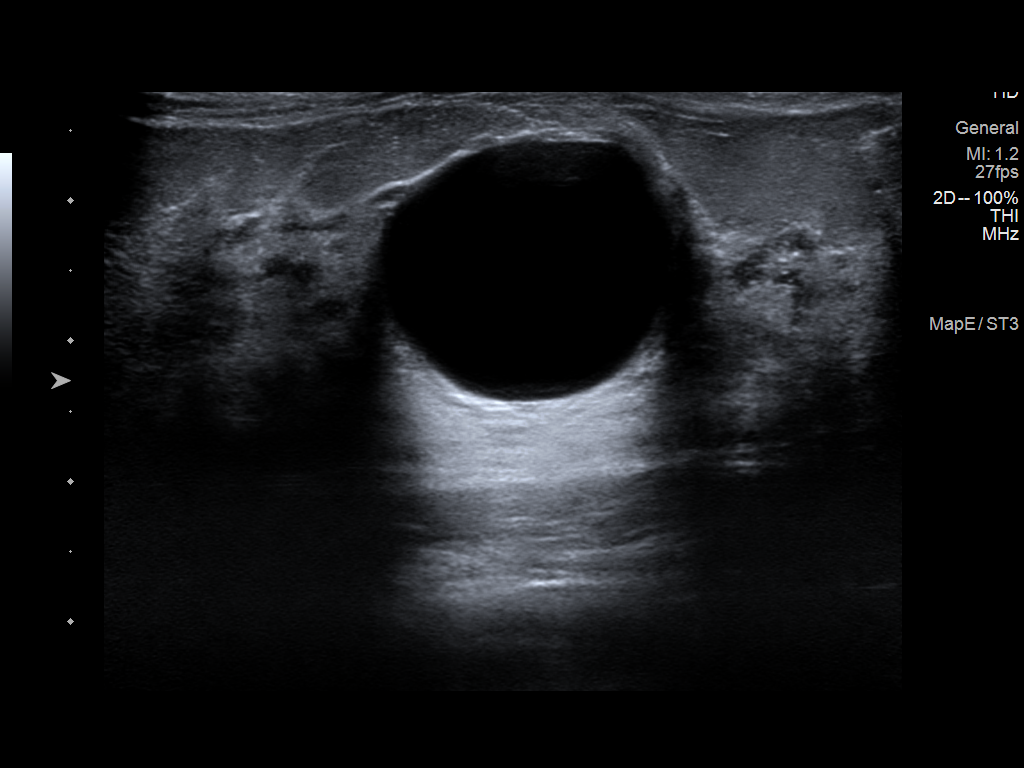
[im 4/5]
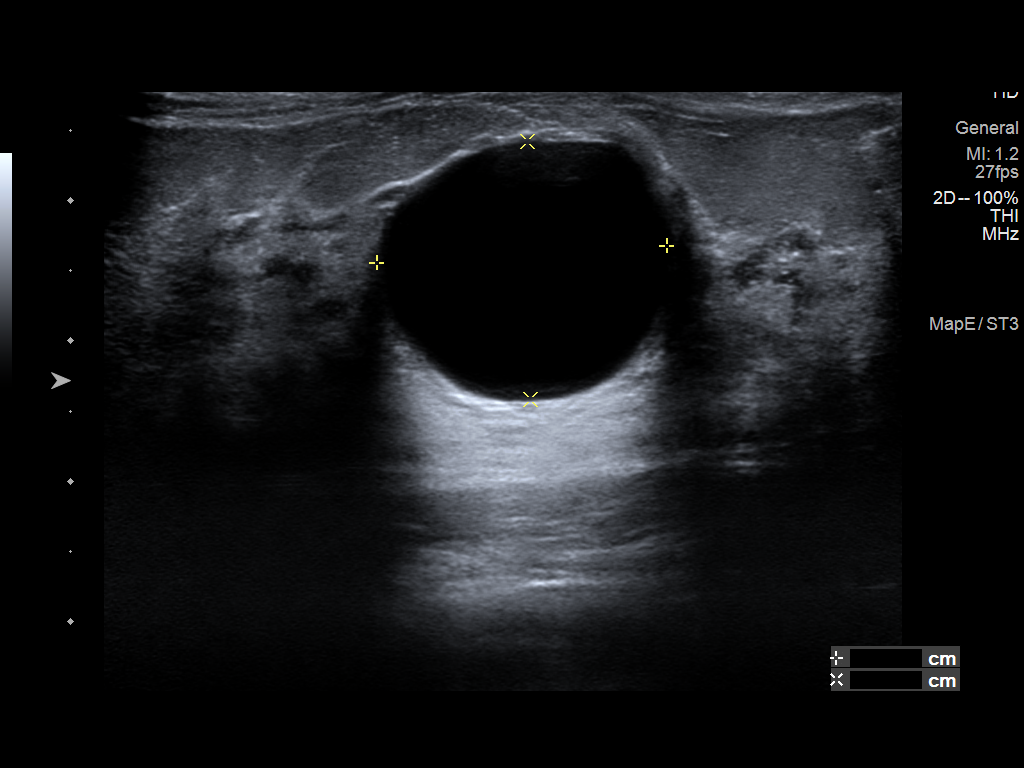
[im 5/5]
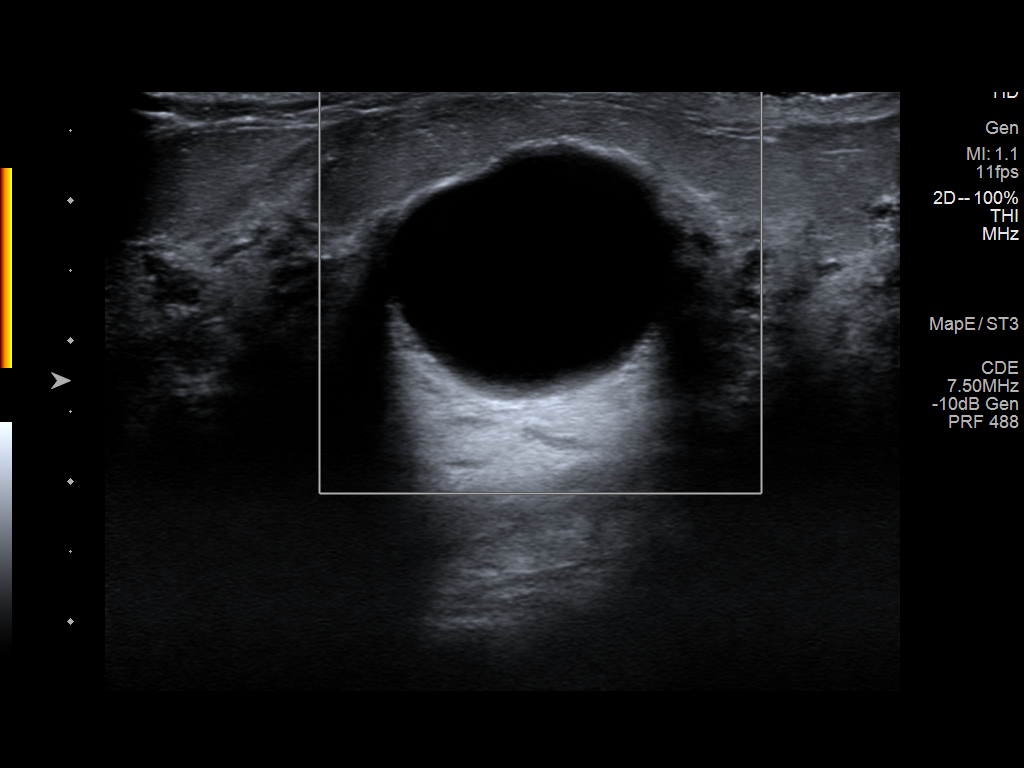

[5 of 5 positions shown; findings below may reference images not displayed]

ACR Breast Density Category c: The breast tissue is heterogeneously
dense, which may obscure small masses.
FINDINGS: There is a 5 mm well-circumscribed mass in the medial aspect of the
right breast and a well-circumscribed 2.4 cm mass in the upper
aspect of the left breast. There are no malignant type
microcalcifications in either breast.

Mammographic images were processed with CAD.

Targeted ultrasound is performed, showing a well-circumscribed
anechoic cyst in left breast 11 o'clock 4 cm from the nipple
measuring 2.2 x 2.1 x 1.8 cm. Sonographic evaluation of the right
breast shows 2 adjacent anechoic cysts at 2 o'clock 3 cm from the
nipple measuring 0.6 x 0.6 x 0.4 cm and 0.8 x 0.5 x 0.5 cm. No solid
mass identified in either breast.
IMPRESSION: Bilateral breast cysts.  No evidence of malignancy in either breast.

RECOMMENDATION:
Bilateral screening mammogram in 1 year is recommended. The patient
states the left breast cyst is associated with pain therefore
aspiration will be scheduled at the patient's convenience.

I have discussed the findings and recommendations with the patient.
If applicable, a reminder letter will be sent to the patient
regarding the next appointment.

BI-RADS CATEGORY  2: Benign.

## 2020-11-14 IMAGING — US US BREAST CYST ASPIRATION 1ST CYST
1 series · 4 of 4 positions shown · non-contrast
Comparison: Previous exams.

CLINICAL DATA: Left breast cyst aspiration

EXAM:
ULTRASOUND GUIDED LEFT BREAST CYST ASPIRATION

[Series 1: us breast cyst aspiration 1st cyst · 0.07mm/px · 4 of 4 slices shown]
[im 1/4]
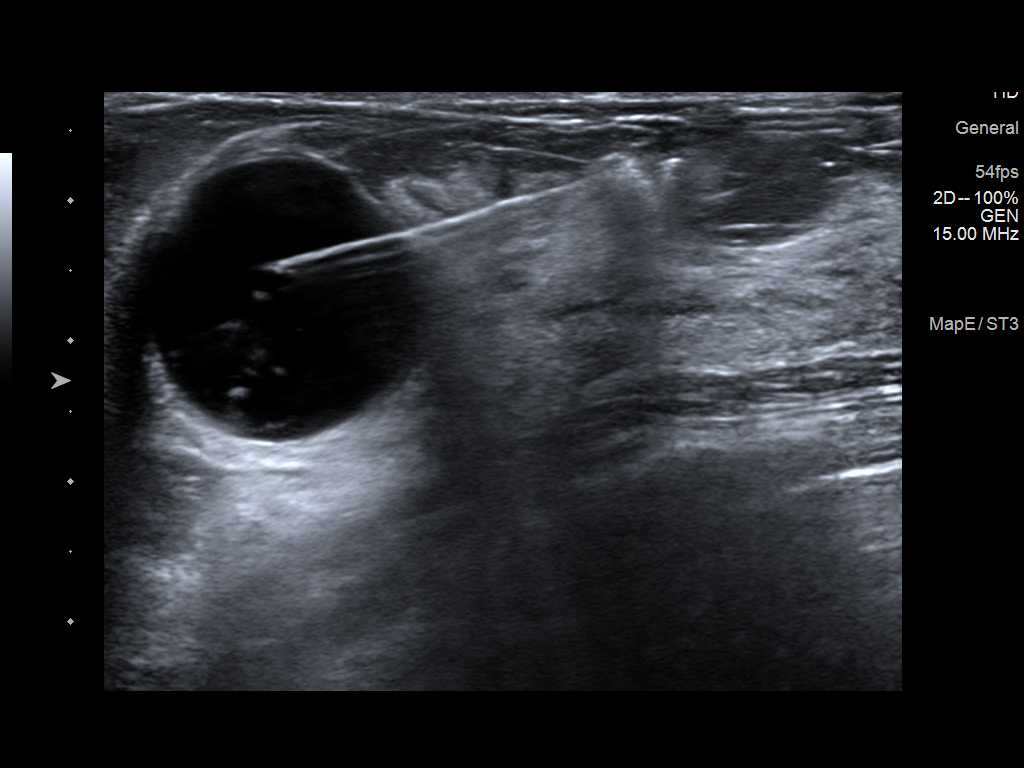
[im 2/4]
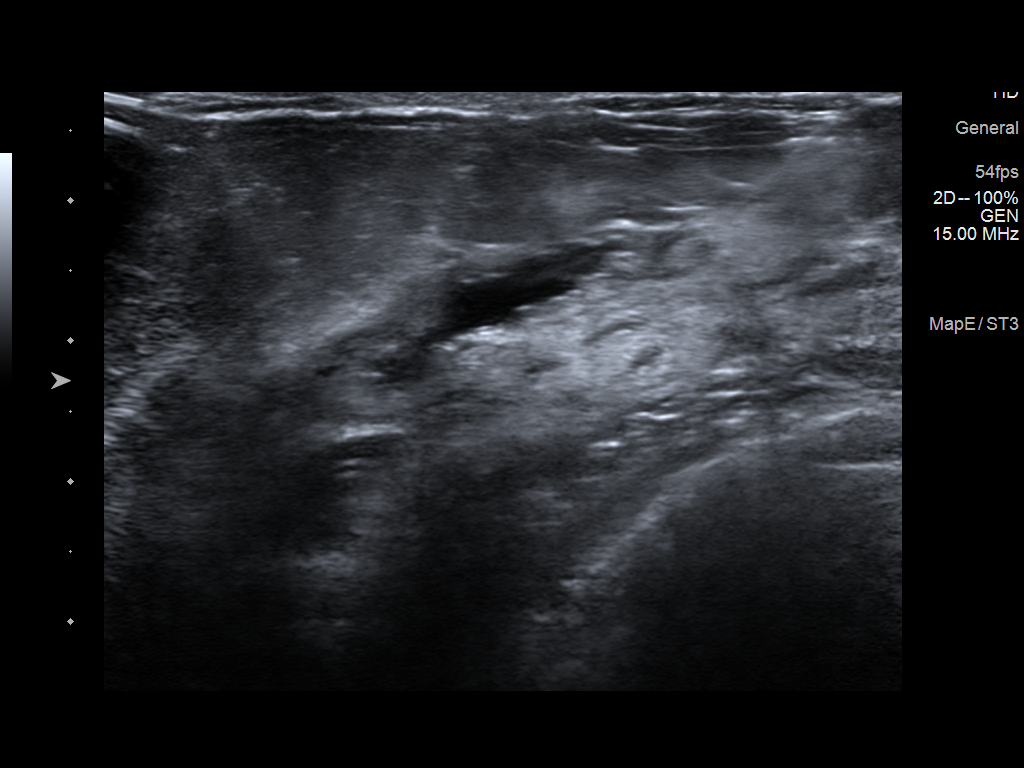
[im 3/4]
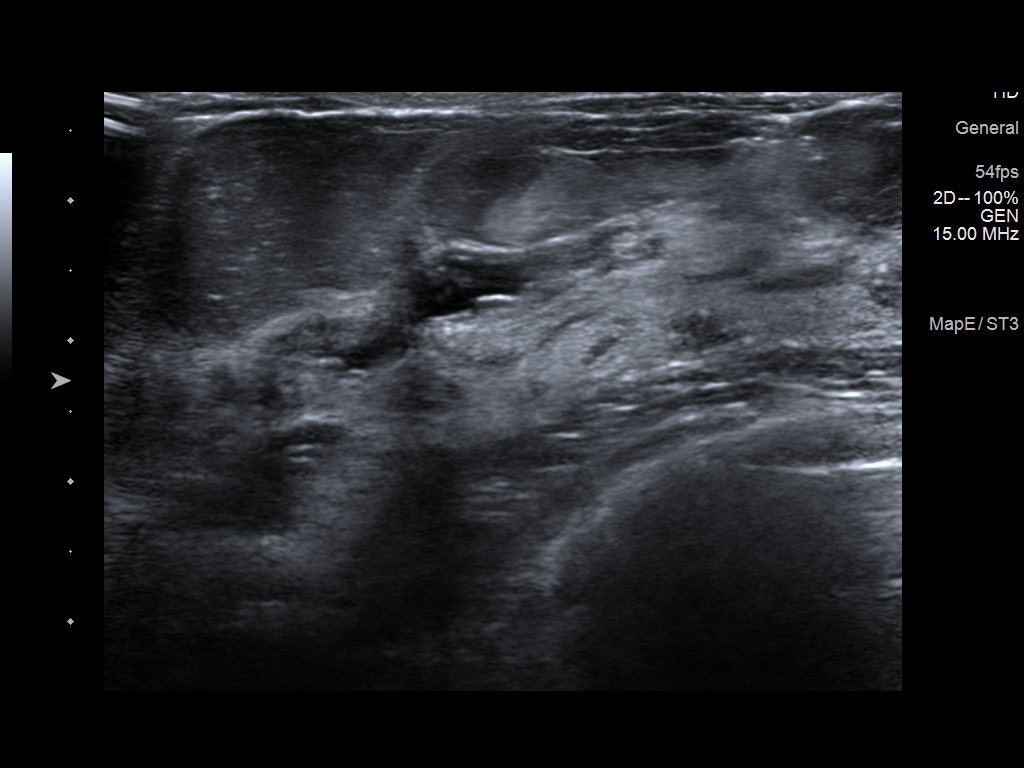
[im 4/4]
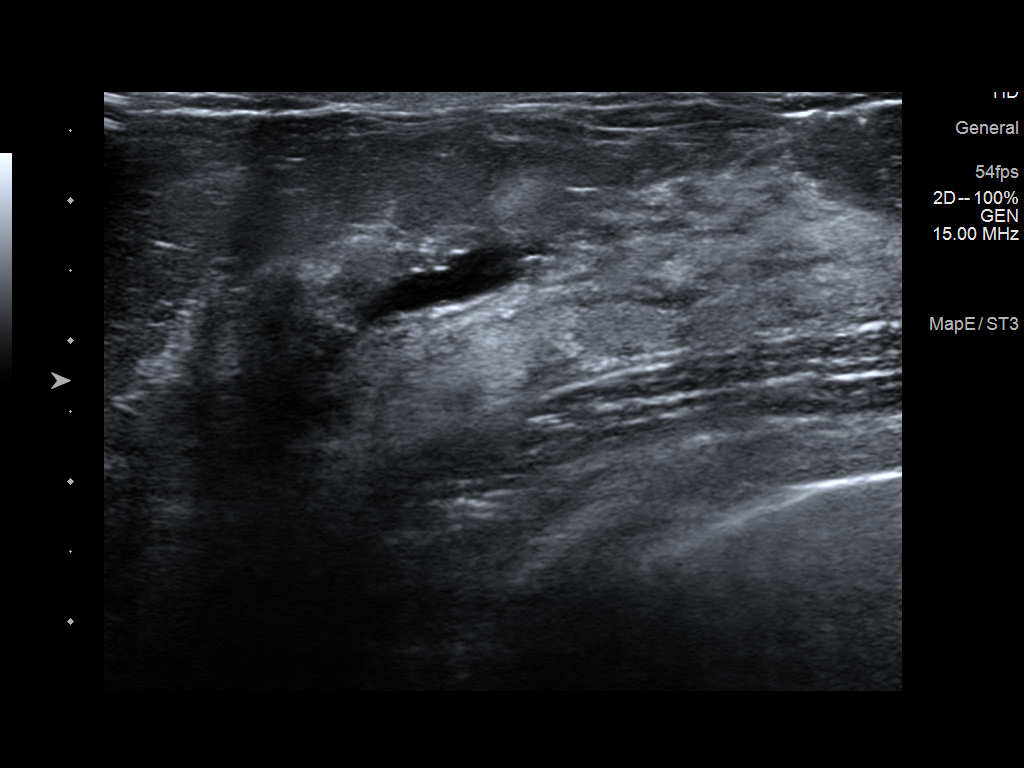

[4 of 4 positions shown; findings below may reference images not displayed]

PROCEDURE:
Using sterile technique, 1% lidocaine, under direct ultrasound
visualization, needle aspiration of a left breast cyst at 11 o'clock
was performed.
IMPRESSION: Ultrasound-guided aspiration of a left breast cyst no apparent
complications.

RECOMMENDATIONS:
Annual screening mammography.

## 2023-10-29 ENCOUNTER — Ambulatory Visit: Payer: Self-pay | Admitting: *Deleted

## 2023-10-29 ENCOUNTER — Encounter: Payer: Self-pay | Admitting: Physician Assistant

## 2023-10-29 ENCOUNTER — Telehealth: Payer: Self-pay

## 2023-10-29 ENCOUNTER — Ambulatory Visit: Payer: Self-pay | Admitting: Physician Assistant

## 2023-10-29 VITALS — BP 145/91 | HR 143 | Ht 66.0 in | Wt 180.0 lb

## 2023-10-29 DIAGNOSIS — R030 Elevated blood-pressure reading, without diagnosis of hypertension: Secondary | ICD-10-CM

## 2023-10-29 DIAGNOSIS — F419 Anxiety disorder, unspecified: Secondary | ICD-10-CM

## 2023-10-29 DIAGNOSIS — N644 Mastodynia: Secondary | ICD-10-CM

## 2023-10-29 DIAGNOSIS — N6314 Unspecified lump in the right breast, lower inner quadrant: Secondary | ICD-10-CM

## 2023-10-29 DIAGNOSIS — K219 Gastro-esophageal reflux disease without esophagitis: Secondary | ICD-10-CM

## 2023-10-29 DIAGNOSIS — R110 Nausea: Secondary | ICD-10-CM

## 2023-10-29 DIAGNOSIS — R000 Tachycardia, unspecified: Secondary | ICD-10-CM

## 2023-10-29 MED ORDER — IBUPROFEN 200 MG PO TABS
800.0000 mg | ORAL_TABLET | Freq: Once | ORAL | Status: AC
  Administered 2023-10-29: 800 mg via ORAL

## 2023-10-29 MED ORDER — IBUPROFEN 800 MG PO TABS: 800.0000 mg | ORAL_TABLET | Freq: Three times a day (TID) | ORAL | 5 refills | Status: AC | PRN

## 2023-10-29 MED ORDER — OMEPRAZOLE 20 MG PO CPDR: 20.0000 mg | DELAYED_RELEASE_CAPSULE | Freq: Every day | ORAL | 3 refills | Status: AC

## 2023-10-29 MED ORDER — ONDANSETRON 4 MG PO TBDP
4.0000 mg | ORAL_TABLET | Freq: Once | ORAL | Status: AC
  Administered 2023-10-29: 4 mg via ORAL

## 2023-10-29 MED ORDER — DOXYCYCLINE HYCLATE 100 MG PO CAPS: 100.0000 mg | ORAL_CAPSULE | Freq: Two times a day (BID) | ORAL | 0 refills | Status: AC

## 2023-10-29 MED ORDER — ONDANSETRON 4 MG PO TBDP: 4.0000 mg | ORAL_TABLET | Freq: Three times a day (TID) | ORAL | 0 refills | Status: AC | PRN

## 2023-10-29 NOTE — Telephone Encounter (Signed)
 FYI Only or Action Required?: FYI only for provider.  Patient was last seen in primary care on no PCP in [facility] system. Called Nurse Triage reporting Breast Mass. Symptoms began yesterday. Interventions attempted: Nothing. Symptoms are: unchanged.  Triage Disposition: See PCP When Office is Open (Within 3 Days)  Patient/caregiver understands and will follow disposition? Patient very anxious- care options reviewed- UC/Mobile unit   Reason for Disposition  Breast lump  Answer Assessment - Initial Assessment Questions 1. SYMPTOM: What's the main symptom you're concerned about?  (e.g., lump, pain, rash, nipple discharge)     Knot- right under the breast/rib area 2. LOCATION: Where is the knot located?     R breast/rib area 3. ONSET: When did knot  start?     yesterday 4. PRIOR HISTORY: Do you have any history of prior problems with your breasts? (e.g., lumps, cancer, fibrocystic breast disease)     Benign cyst- dense breast 5. CAUSE: What do you think is causing this symptom?     Not sure 6. OTHER SYMPTOMS: Do you have any other symptoms? (e.g., fever, breast pain, redness or rash, nipple discharge)     Hormonal anxiety  Protocols used: Breast Symptoms-A-AH : Copied from CRM [id number]. Topic: Clinical - Red Word Triage >> Oct 29, 2023  8:31 AM Robinson DEL wrote: [facility] that prompted transfer to Nurse Triage: Knot under right breast, past history of benign

## 2023-10-29 NOTE — Progress Notes (Signed)
 New Patient Office Visit  Subjective    Patient ID: April Newton, female    DOB: [birth year]  Age: 43 y.o. MRN: [id number]  CC:  Chief Complaint  Patient presents with   Breast Problem    Patient noticed a small lump Along the beast line, of right breast.  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    Discussed the use of AI scribe software for clinical note transcription with the patient, who gave verbal consent to proceed.  History of Present Illness  April Newton is a 43 year old female who presents with a painful lump in the right breast.  She noticed a small, tender lump along her right breast line after showering last night. The lump is located at the base of the breast and is more noticeable when she lays back flat. She has a history of similar issues, having had a cyst in her left breast in 2021 that was drained due to discomfort. She was previously informed of small benign tumors and cysts in her breasts.  She has been experiencing anxiety for the past two weeks, affecting her appetite and causing stomach pains. She takes Xanax 1 mg as needed, which she has been using more frequently due to recent stress. Her sleep has been significantly disrupted, with only one to two hours of sleep last night.  She has been experiencing spotting since last Thursday or Friday, accompanied by back pain and cramp-like symptoms. She underwent a uterine ablation in the past due to heavy periods, which had improved until recently.  She is concerned about her blood pressure, having previously taken hydrochlorothiazide for hypertension. She has not been monitoring her blood pressure regularly. She reports stomach discomfort, which she attributes to anxiety, and has been experiencing indigestion and nausea. She has taken Aleve for pain relief but cannot recall if it was effective.     Outpatient Encounter Medications as of 10/29/2023  Medication Sig    doxycycline (VIBRAMYCIN) 100 MG capsule Take 1 capsule (100 mg total) by mouth 2 (two) times daily.   omeprazole (PRILOSEC) 20 MG capsule Take 1 capsule (20 mg total) by mouth daily.   ondansetron  (ZOFRAN -ODT) 4 MG disintegrating tablet Take 1 tablet (4 mg total) by mouth every 8 (eight) hours as needed for nausea or vomiting.   ALPRAZolam (XANAX) 0.5 MG tablet Take 0.5 mg by mouth 3 (three) times daily as needed.   butalbital-acetaminophen -caffeine (FIORICET, ESGIC) 50-325-40 MG per tablet TAKE 2 TABLETS EVERY 6 HOURS AS NEEDED (Patient not taking: Reported on 09/21/2016)   cefdinir  (OMNICEF ) 300 MG capsule Take 1 capsule (300 mg total) by mouth 2 (two) times daily. (Patient not taking: Reported on 10/23/2016)   fluconazole  (DIFLUCAN ) 150 MG tablet TAKE 1 TABLET (150 MG TOTAL) BY MOUTH ONCE. (Patient not taking: Reported on 09/21/2016)   fluconazole  (DIFLUCAN ) 150 MG tablet Take one now and one in a week (Patient not taking: Reported on 10/23/2016)   fluticasone  (FLONASE ) 50 MCG/ACT nasal spray Place 2 sprays into both nostrils daily.   ibuprofen (ADVIL) 800 MG tablet Take 1 tablet (800 mg total) by mouth every 8 (eight) hours as needed.   topiramate (TOPAMAX) 50 MG tablet Take 50 mg by mouth daily.   [DISCONTINUED] ibuprofen (ADVIL,MOTRIN) 800 MG tablet TAKE 1 TABLET BY MOUTH EVERY 8 HOURS AS NEEDED   [EXPIRED] ibuprofen (ADVIL) tablet 800 mg    [EXPIRED] ondansetron  (ZOFRAN -ODT) disintegrating tablet 4 mg  No facility-administered encounter medications on file as of 10/29/2023.    Past Medical History:  Diagnosis Date   Anxiety    Headache(784.0)    Heart murmur    pt. states she saw a cardiologist in [place]  for her heart murmur. Does not remember his name and has not seen anyone since    Past Surgical History:  Procedure Laterality Date   BREAST DUCTAL SYSTEM EXCISION  10/23/2011   Procedure: EXCISION DUCTAL SYSTEM BREAST;  Surgeon: Sherlean JINNY Laughter, MD;  Location: [facility] OR;   Service: General;  Laterality: Right;   BREAST EXCISIONAL BIOPSY Right 2013   CESAREAN SECTION  06/15/2001   CHOLECYSTECTOMY  2003   uterine ablasion  2010    Family History  Problem Relation Age of Onset   Breast cancer Maternal Aunt    Breast cancer Maternal Aunt     Social History   Socioeconomic History   Marital status: Married    Spouse name: Not on file   Number of children: Not on file   Years of education: Not on file   Highest education level: Not on file  Occupational History   Not on file  Tobacco Use   Smoking status: Never   Smokeless tobacco: Never  Vaping Use   Vaping status: Never Used  Substance and Sexual Activity   Alcohol use: Yes    Comment: rarely   Drug use: No   Sexual activity: Not on file  Other Topics Concern   Not on file  Social History Narrative   Not on file   Social Drivers of Health   Financial Resource Strain: Not on file  Food Insecurity: Not on file  Transportation Needs: Not on file  Physical Activity: Not on file  Stress: Not on file  Social Connections: Unknown (09/13/2021)   Received from [facility]   Social Network    Social Network: Not on file  Intimate Partner Violence: Unknown (08/09/2021)   Received from [facility]   HITS    Physically Hurt: Not on file    Insult or Talk Down To: Not on file    Threaten Physical Harm: Not on file    Scream or Curse: Not on file    Review of Systems  Constitutional:  Negative for chills and fever.  HENT: Negative.    Eyes: Negative.   Respiratory:  Negative for shortness of breath.   Cardiovascular:  Negative for chest pain.  Gastrointestinal:  Positive for heartburn and nausea.  Genitourinary: Negative.   Musculoskeletal: Negative.   Skin: Negative.   Neurological: Negative.   Endo/Heme/Allergies: Negative.   Psychiatric/Behavioral:  The patient is nervous/anxious and has insomnia.         Objective    BP (!) 145/91 (BP Location: Left Arm, Patient Position:  Sitting, Cuff Size: Normal)   Pulse (!) 143   Ht 5' 6 (1.676 m)   Wt 180 lb (81.6 kg)   LMP 10/14/2023   BMI 29.05 kg/m   Physical Exam Vitals and nursing note reviewed. Exam conducted with a chaperone present.  Constitutional:      Appearance: Normal appearance.  HENT:     Head: Normocephalic and atraumatic.     Right Ear: External ear normal.     Left Ear: External ear normal.     Nose: Nose normal.     Mouth/Throat:     Mouth: Mucous membranes are moist.     Pharynx: Oropharynx is clear.   Eyes:  Extraocular Movements: Extraocular movements intact.     Conjunctiva/sclera: Conjunctivae normal.     Pupils: Pupils are equal, round, and reactive to light.    Cardiovascular:     Rate and Rhythm: Regular rhythm. Tachycardia present.     Pulses: Normal pulses.     Heart sounds: Normal heart sounds.  Pulmonary:     Effort: Pulmonary effort is normal.     Breath sounds: Normal breath sounds.  Chest:  Breasts:    Right: Tenderness present. No swelling, inverted nipple, nipple discharge or skin change.     Left: Normal.      Comments: Noted area of extreme tenderness. Unable to fully palpate area due to exquisite tenderness and heightened patient anxiety.  Musculoskeletal:     Cervical back: Normal range of motion and neck supple.  Lymphadenopathy:     Upper Body:     Right upper body: No supraclavicular, axillary or pectoral adenopathy.   Neurological:     Mental Status: She is alert.   Psychiatric:        Attention and Perception: Attention normal.        Mood and Affect: Mood is anxious. Affect is tearful.        Speech: Speech normal.        Behavior: Behavior normal.        Thought Content: Thought content does not include homicidal or suicidal plan.        Cognition and Memory: Cognition and memory normal.        Judgment: Judgment normal.         Assessment & Plan:   Problem List Items Addressed This Visit   None Visit Diagnoses        Tachycardia    -  Primary   Relevant Medications   ibuprofen (ADVIL) tablet 800 mg (Completed)   ondansetron  (ZOFRAN -ODT) disintegrating tablet 4 mg (Completed)   omeprazole (PRILOSEC) 20 MG capsule   ondansetron  (ZOFRAN -ODT) 4 MG disintegrating tablet   ibuprofen (ADVIL) 800 MG tablet   Other Relevant Orders   CBC with Differential/Platelet   Comp. Metabolic Panel (12)   TSH     Mass of lower inner quadrant of right breast       Relevant Medications   doxycycline (VIBRAMYCIN) 100 MG capsule     Breast pain, right       Relevant Medications   doxycycline (VIBRAMYCIN) 100 MG capsule   Other Relevant Orders   MM Digital Diagnostic Unilat R   US  BREAST COMPLETE UNI RIGHT INC AXILLA     Elevated blood pressure reading in office without diagnosis of hypertension         Anxiety         1. Mass of lower inner quadrant of right breast Unable to palpate, both patient and partner attest to feeling lump, due to tenderness, will order imaging and start course of doxycycline - doxycycline (VIBRAMYCIN) 100 MG capsule; Take 1 capsule (100 mg total) by mouth 2 (two) times daily.  Dispense: 20 capsule; Refill: 0  2. Breast pain, right  - doxycycline (VIBRAMYCIN) 100 MG capsule; Take 1 capsule (100 mg total) by mouth 2 (two) times daily.  Dispense: 20 capsule; Refill: 0 - MM Digital Diagnostic Unilat R; Future - US  BREAST COMPLETE UNI RIGHT INC AXILLA; Future - Patient given ibuprofen in clinic Trial ibuprofen and ice for supportive care  3. Tachycardia (Primary) Patient encouraged to check bp / pulse at home.  Given appt to establish care  at [facility], given [facility]FA application. - CBC with Differential/Platelet - Comp. Metabolic Panel (12) - TSH  4. Elevated blood pressure reading in office without diagnosis of hypertension   5. Anxiety Continue current treatment regimen  6. Nausea Trial zofran  Patient education given on supportive care  7. GERD Trial prilosec Patient education given on  supportive care   I have reviewed the patient's medical history (PMH, PSH, Social History, Family History, Medications, and allergies) , and have been updated if relevant. I spent 30 minutes reviewing chart and  face to face time with patient.     Return in about 5 weeks (around 12/03/2023) for with Greig Drones, NP at Primary Care at [facility].   Kirk RAMAN Mayers, PA-C

## 2023-10-29 NOTE — Telephone Encounter (Signed)
 Patient presents to unit with Right breast pain located along breast line. Patient describes as lump smaller than a dime sized.   Called diagnostic breast center, to schedule patient for Diagnostic Mammogram.   A Vm was left for the [employer] Scholarship program. Patient was updated.

## 2023-10-29 NOTE — Patient Instructions (Addendum)
 We have ordered a diagnostic mammogram and an ultrasound of your right breast for further evaluation.  I encourage you to use ice, ibuprofen as needed to help relieve the pain.  I sent a prescription for ibuprofen, Zofran  to help with nausea, and Prilosec to help with your stomach.  You are also going to take doxycycline twice a day for 10 days.  I do encourage you to check your blood pressure at home, keep a written log and have available for all office visits.  We will call you with today's lab results as soon as they are available.  Breast Tenderness Breast tenderness is a common problem for women of all ages, but may also occur in men. Breast tenderness has many possible causes, including hormone changes, infections, taking certain medicines, and caffeine intake. In women, the pain usually comes and goes with the menstrual cycle, but it can also be constant. Breast tenderness may range from mild discomfort to severe pain. You may have tests, such as a mammogram or an ultrasound, to check for any unusual findings. Having breast tenderness usually does not mean that you have breast cancer. Follow these instructions at home: Managing pain and discomfort  If directed, put ice on the painful area. To do this: Put ice in a plastic bag. Place a towel between your skin and the bag. Leave the ice on for 20 minutes, 2-3 times a day. If your skin turns bright red, remove the ice right away to prevent skin damage. The risk of skin damage is higher if you cannot feel pain, heat, or cold. Wear a supportive bra or chest support: During exercise. While sleeping, if your breasts are very tender. Medicines Take over-the-counter and prescription medicines only as told by your health care provider. If the cause of your pain is an infection, you may be prescribed an antibiotic medicine. If you were prescribed antibiotics, take them as told by your health care provider. Do not stop using the antibiotic even  if you start to feel better. Eating and drinking Decrease the amount of caffeine in your diet. Instead, drink more water and choose caffeine-free drinks. Your health care provider may recommend that you lessen the amount of fat in your diet. You can do this by: Limiting fried foods. Cooking foods using methods such as baking, boiling, grilling, and broiling. General instructions  Keep a log of the days and times when your breasts are most tender. Ask your health care provider how to do breast exams at home. This will help you notice if you have an unusual growth or lump. Keep all follow-up visits. Contact a health care provider if: Any part of your breast is hard, red, and hot to the touch. This may be a sign of infection. You are a woman and have a new or painful lump in your breast that remains after your menstrual period ends. You are not breastfeeding and you have fluid, especially blood or pus, coming out of your nipples. You have a fever. Your pain does not improve or it gets worse. Your pain is interfering with your daily activities. Summary Breast tenderness may range from mild discomfort to severe pain. Breast tenderness has many possible causes, including hormone changes, infections, taking certain medicines, and caffeine intake. It can be treated with ice, wearing a supportive bra or chest support, and medicines. Make changes to your diet as told by your health care provider. This information is not intended to replace advice given to you by your health  care provider. Make sure you discuss any questions you have with your health care provider. Document Revised: 07/05/2021 Document Reviewed: 07/05/2021 Elsevier Patient Education  2024 [employer].

## 2023-10-30 ENCOUNTER — Ambulatory Visit: Payer: Self-pay | Admitting: Physician Assistant

## 2023-10-30 ENCOUNTER — Telehealth: Payer: Self-pay

## 2023-10-30 LAB — CBC WITH DIFFERENTIAL/PLATELET
Basophils Absolute: 0 10*3/uL (ref 0.0–0.2)
Basos: 0 %
EOS (ABSOLUTE): 0 10*3/uL (ref 0.0–0.4)
Eos: 1 %
Hematocrit: 43.4 % (ref 34.0–46.6)
Hemoglobin: 13.8 g/dL (ref 11.1–15.9)
Immature Grans (Abs): 0 10*3/uL (ref 0.0–0.1)
Immature Granulocytes: 0 %
Lymphocytes Absolute: 1.1 10*3/uL (ref 0.7–3.1)
Lymphs: 24 %
MCH: 30.3 pg (ref 26.6–33.0)
MCHC: 31.8 g/dL (ref 31.5–35.7)
MCV: 95 fL (ref 79–97)
Monocytes Absolute: 0.3 10*3/uL (ref 0.1–0.9)
Monocytes: 8 %
Neutrophils Absolute: 3 10*3/uL (ref 1.4–7.0)
Neutrophils: 67 %
Platelets: 340 10*3/uL (ref 150–450)
RBC: 4.55 x10E6/uL (ref 3.77–5.28)
RDW: 12.4 % (ref 11.7–15.4)
WBC: 4.4 10*3/uL (ref 3.4–10.8)

## 2023-10-30 LAB — TSH: TSH: 1.08 u[IU]/mL (ref 0.450–4.500)

## 2023-10-30 LAB — COMP. METABOLIC PANEL (12)
AST: 17 IU/L (ref 0–40)
Albumin: 4.8 g/dL (ref 3.9–4.9)
Alkaline Phosphatase: 84 IU/L (ref 44–121)
BUN/Creatinine Ratio: 20 (ref 9–23)
BUN: 14 mg/dL (ref 6–24)
Bilirubin Total: 0.4 mg/dL (ref 0.0–1.2)
Calcium: 9.9 mg/dL (ref 8.7–10.2)
Chloride: 98 mmol/L (ref 96–106)
Creatinine, Ser: 0.69 mg/dL (ref 0.57–1.00)
Globulin, Total: 3.2 g/dL (ref 1.5–4.5)
Glucose: 83 mg/dL (ref 70–99)
Potassium: 3.8 mmol/L (ref 3.5–5.2)
Sodium: 138 mmol/L (ref 134–144)
Total Protein: 8 g/dL (ref 6.0–8.5)
eGFR: 109 mL/min/{1.73_m2} (ref >59)

## 2023-10-30 NOTE — Telephone Encounter (Signed)
 Telephoned patient at mobile. Left a voice message with [facility] scheduling contact information.

## 2023-10-30 NOTE — Telephone Encounter (Signed)
 After completing screening process, patient ineligible for the [facility] program. Advised patient to contact [facility] for self pay options.

## 2023-10-31 ENCOUNTER — Telehealth: Payer: Self-pay

## 2023-10-31 ENCOUNTER — Ambulatory Visit (INDEPENDENT_AMBULATORY_CARE_PROVIDER_SITE_OTHER): Payer: Self-pay

## 2023-10-31 NOTE — Telephone Encounter (Signed)
 Patient contacted and updated with self pay opportunities:  [facility]   Located: [address] [place], [place] [address]   Phone: [phone]

## 2023-10-31 NOTE — Telephone Encounter (Signed)
 Patient updated with Self Pay opportunities  [facility]   Located: [address] [place], [place] [address]  Phone: [phone]  Hours: Thursday-Friday 7am-5pm, Closed- Sat&Sun   Financial:   Diagnostic Bilateral Mammogram includes-Both Breast- 190.00- Total 3D-Confirmed- Breast 3D US  Limited(order), includes-Both breast-198.00-Total   Payment plan option: Day of appointment pay 25% of 388=97.00 Then set up payment plan for the remaining balance once bill is received.   Next Step: Visit with Josselin tomorrow, at 2pm.She will have some other Financial options to discuss with you tomorrow.   In order to get the Provider order request from [facility] an appointment will first need to be scheduled.   We can follow up, once you have made your decision.

## 2023-11-01 ENCOUNTER — Ambulatory Visit: Payer: Self-pay

## 2023-11-01 ENCOUNTER — Ambulatory Visit: Payer: Self-pay | Attending: Nurse Practitioner

## 2023-11-28 ENCOUNTER — Ambulatory Visit: Payer: Self-pay | Admitting: Family Medicine

## 2023-11-29 ENCOUNTER — Encounter: Payer: Self-pay | Admitting: Physician Assistant

## 2023-12-03 ENCOUNTER — Telehealth: Payer: Self-pay | Admitting: Nurse Practitioner

## 2023-12-03 ENCOUNTER — Encounter: Payer: Self-pay | Admitting: Family

## 2023-12-03 NOTE — Progress Notes (Signed)
 Erroneous encounter-disregard

## 2023-12-03 NOTE — Telephone Encounter (Signed)
 Called pt and left vm to call office back to reschedule missed appt at [facility]

## 2023-12-20 ENCOUNTER — Other Ambulatory Visit: Payer: Self-pay

## 2024-01-24 ENCOUNTER — Inpatient Hospital Stay: Admission: RE | Admit: 2024-01-24 | Payer: Self-pay | Source: Ambulatory Visit
